# Patient Record
Sex: Female | Born: 1990 | Hispanic: Yes | Marital: Married | Smoking: Never smoker
Health system: Southern US, Community
[De-identification: ages and names within clinical notes are randomized; demographics above are authoritative.]

## PROBLEM LIST (undated history)

## (undated) ENCOUNTER — Inpatient Hospital Stay (HOSPITAL_COMMUNITY): Payer: Self-pay

## (undated) DIAGNOSIS — R51 Headache: Secondary | ICD-10-CM

## (undated) DIAGNOSIS — O039 Complete or unspecified spontaneous abortion without complication: Secondary | ICD-10-CM

## (undated) DIAGNOSIS — K219 Gastro-esophageal reflux disease without esophagitis: Secondary | ICD-10-CM

## (undated) DIAGNOSIS — O99619 Diseases of the digestive system complicating pregnancy, unspecified trimester: Secondary | ICD-10-CM

## (undated) DIAGNOSIS — N83209 Unspecified ovarian cyst, unspecified side: Secondary | ICD-10-CM

## (undated) DIAGNOSIS — D649 Anemia, unspecified: Secondary | ICD-10-CM

## (undated) DIAGNOSIS — Z789 Other specified health status: Secondary | ICD-10-CM

## (undated) DIAGNOSIS — O09299 Supervision of pregnancy with other poor reproductive or obstetric history, unspecified trimester: Secondary | ICD-10-CM

## (undated) HISTORY — PX: DILATION AND CURETTAGE OF UTERUS: SHX78

## (undated) HISTORY — DX: Complete or unspecified spontaneous abortion without complication: O03.9

## (undated) HISTORY — PX: LAPAROSCOPY: SHX197

## (undated) HISTORY — PX: WISDOM TOOTH EXTRACTION: SHX21

## (undated) HISTORY — DX: Anemia, unspecified: D64.9

---
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
## 2008-09-06 ENCOUNTER — Emergency Department: Payer: Self-pay | Admitting: Emergency Medicine

## 2009-01-06 ENCOUNTER — Ambulatory Visit: Payer: Self-pay | Admitting: Family Medicine

## 2009-01-12 ENCOUNTER — Emergency Department: Payer: Self-pay | Admitting: Unknown Physician Specialty

## 2009-01-15 ENCOUNTER — Ambulatory Visit: Payer: Self-pay | Admitting: Obstetrics and Gynecology

## 2009-01-19 ENCOUNTER — Observation Stay: Payer: Self-pay | Admitting: Obstetrics and Gynecology

## 2009-02-04 ENCOUNTER — Ambulatory Visit: Payer: Self-pay | Admitting: Obstetrics and Gynecology

## 2009-04-19 ENCOUNTER — Emergency Department: Payer: Self-pay | Admitting: Emergency Medicine

## 2011-06-20 ENCOUNTER — Emergency Department: Payer: Self-pay | Admitting: Emergency Medicine

## 2011-07-26 ENCOUNTER — Emergency Department: Payer: Self-pay | Admitting: Emergency Medicine

## 2011-07-26 LAB — URINALYSIS, COMPLETE
Bacteria: NONE SEEN
Ph: 6 (ref 4.5–8.0)
Protein: 30

## 2011-07-26 LAB — CBC
HCT: 41.3 % (ref 35.0–47.0)
MCHC: 32.9 g/dL (ref 32.0–36.0)
MCV: 90 fL (ref 80–100)
Platelet: 290 10*3/uL (ref 150–440)
RBC: 4.57 10*6/uL (ref 3.80–5.20)
WBC: 8 10*3/uL (ref 3.6–11.0)

## 2011-07-26 LAB — WET PREP, GENITAL

## 2011-09-22 ENCOUNTER — Ambulatory Visit: Payer: Self-pay | Admitting: Obstetrics and Gynecology

## 2011-09-22 LAB — CBC
HGB: 12.7 g/dL (ref 12.0–16.0)
MCH: 29.8 pg (ref 26.0–34.0)
MCHC: 32.7 g/dL (ref 32.0–36.0)

## 2011-09-22 LAB — COMPREHENSIVE METABOLIC PANEL
Albumin: 4.2 g/dL (ref 3.4–5.0)
Alkaline Phosphatase: 86 U/L (ref 50–136)
Anion Gap: 5 — ABNORMAL LOW (ref 7–16)
BUN: 6 mg/dL — ABNORMAL LOW (ref 7–18)
Bilirubin,Total: 0.3 mg/dL (ref 0.2–1.0)
Creatinine: 0.66 mg/dL (ref 0.60–1.30)
EGFR (African American): >60
EGFR (Non-African Amer.): >60
Glucose: 83 mg/dL (ref 65–99)
Osmolality: 272 (ref 275–301)
SGPT (ALT): 19 U/L (ref 12–78)
Total Protein: 8.2 g/dL (ref 6.4–8.2)

## 2011-09-23 LAB — HCG, QUANTITATIVE, PREGNANCY: Beta Hcg, Quant.: 104 m[IU]/mL — ABNORMAL HIGH

## 2011-09-27 LAB — PATHOLOGY REPORT

## 2011-12-06 ENCOUNTER — Inpatient Hospital Stay (HOSPITAL_COMMUNITY)
Admission: AD | Admit: 2011-12-06 | Discharge: 2011-12-07 | Disposition: A | Discharge status: Home / Self Care | Source: Ambulatory Visit | Attending: Obstetrics and Gynecology | Admitting: Obstetrics and Gynecology

## 2011-12-06 ENCOUNTER — Encounter (HOSPITAL_COMMUNITY): Payer: Self-pay | Admitting: *Deleted

## 2011-12-06 DIAGNOSIS — N949 Unspecified condition associated with female genital organs and menstrual cycle: Secondary | ICD-10-CM | POA: Insufficient documentation

## 2011-12-06 DIAGNOSIS — R1031 Right lower quadrant pain: Secondary | ICD-10-CM | POA: Insufficient documentation

## 2011-12-06 DIAGNOSIS — R102 Pelvic and perineal pain: Secondary | ICD-10-CM

## 2011-12-06 HISTORY — DX: Other specified health status: Z78.9

## 2011-12-06 HISTORY — DX: Unspecified ovarian cyst, unspecified side: N83.209

## 2011-12-06 LAB — WET PREP, GENITAL
Clue Cells Wet Prep HPF POC: NONE SEEN
WBC, Wet Prep HPF POC: NONE SEEN

## 2011-12-06 LAB — URINALYSIS, ROUTINE W REFLEX MICROSCOPIC
Ketones, ur: NEGATIVE mg/dL
Leukocytes, UA: NEGATIVE
Nitrite: NEGATIVE
pH: 6.5 (ref 5.0–8.0)

## 2011-12-06 LAB — URINE MICROSCOPIC-ADD ON

## 2011-12-06 MED ORDER — AZITHROMYCIN 250 MG PO TABS
1000.0000 mg | ORAL_TABLET | ORAL | Status: AC
  Administered 2011-12-06: 1000 mg via ORAL
  Filled 2011-12-06: qty 4

## 2011-12-06 MED ORDER — KETOROLAC TROMETHAMINE 60 MG/2ML IM SOLN
60.0000 mg | Freq: Once | INTRAMUSCULAR | Status: AC
  Administered 2011-12-06: 60 mg via INTRAMUSCULAR
  Filled 2011-12-06: qty 2

## 2011-12-06 MED ORDER — CEFTRIAXONE SODIUM 250 MG IJ SOLR
250.0000 mg | INTRAMUSCULAR | Status: AC
  Administered 2011-12-06: 250 mg via INTRAMUSCULAR
  Filled 2011-12-06: qty 250

## 2011-12-06 NOTE — MAU Provider Note (Signed)
History     CSN: [id number]  Arrival date and time: 12/06/11 2157   First Provider Initiated Contact with Patient 12/06/11 2330      Chief Complaint  Patient presents with  . Abdominal Pain   HPI  Pt here with report of pelvic pain for past month that is usually intermittent in nature; however, tonight it is constant and described as sharp, rated a 9/10.  +nausea, no report of vomiting.  Hx of ovarian cyst.  Patient's last menstrual period was 11/11/2011.   Past Medical History  Diagnosis Date  . No pertinent past medical history   . Ovarian cyst     Past Surgical History  Procedure Date  . Dilation and curettage of uterus   . Laparoscopy     Family History  Problem Relation Age of Onset  . Other Neg Hx     History  Substance Use Topics  . Smoking status: Light Tobacco Smoker  . Smokeless tobacco: Not on file  . Alcohol Use: No    Allergies: No Known Allergies  Prescriptions prior to admission  Medication Sig Dispense Refill  . ibuprofen (ADVIL,MOTRIN) 200 MG tablet Take 400 mg by mouth every 6 (six) hours as needed.        Review of Systems  Constitutional: Negative.  Negative for fever and chills.  Gastrointestinal: Positive for nausea, abdominal pain (RLQ) and constipation. Negative for vomiting and diarrhea.  Genitourinary: Negative.   Neurological: Tingling: No abnormal vaginal discharge.   Physical Exam   Blood pressure 130/80, pulse 104, temperature 98.1 F (36.7 C), temperature source Oral, resp. rate 20, height 5\' 5"  (1.651 m), weight 68.04 kg (150 lb), last menstrual period 11/11/2011, SpO2 100.00%, unknown if currently breastfeeding.  Physical Exam  Constitutional: She is oriented to person, place, and time. She appears well-developed and well-nourished. No distress.       Uncomfortable with exam  HENT:  Head: Normocephalic.  Neck: Normal range of motion. Neck supple.  Cardiovascular: Normal rate, regular rhythm and normal heart sounds.     Respiratory: Effort normal and breath sounds normal.  GI: Soft. She exhibits no mass. There is tenderness (with deep palpation). There is no rebound and no guarding.  Genitourinary: Uterus is tender. Uterus is not enlarged. Cervix exhibits motion tenderness. Cervix exhibits no discharge. No bleeding around the vagina. No vaginal discharge found.  Neurological: She is alert and oriented to person, place, and time. She has normal reflexes.  Skin: Skin is warm and dry.    MAU Course  Procedures Zithromax 1 gm Rocephin 250 mg IM Toradol 60mg  IM  Results for orders placed during the hospital encounter of 12/06/11 (from the past 24 hour(s))  URINALYSIS, ROUTINE W REFLEX MICROSCOPIC     Status: Abnormal   Collection Time   12/06/11 10:15 PM      Component Value Range   Color, Urine YELLOW  YELLOW   APPearance CLEAR  CLEAR   Specific Gravity, Urine 1.025  1.005 - 1.030   pH 6.5  5.0 - 8.0   Glucose, UA NEGATIVE  NEGATIVE mg/dL   Hgb urine dipstick SMALL (*) NEGATIVE   Bilirubin Urine NEGATIVE  NEGATIVE   Ketones, ur NEGATIVE  NEGATIVE mg/dL   Protein, ur NEGATIVE  NEGATIVE mg/dL   Urobilinogen, UA 0.2  0.0 - 1.0 mg/dL   Nitrite NEGATIVE  NEGATIVE   Leukocytes, UA NEGATIVE  NEGATIVE  URINE MICROSCOPIC-ADD ON     Status: Normal   Collection Time  12/06/11 10:15 PM      Component Value Range   Squamous Epithelial / LPF RARE  RARE   RBC / HPF 0-2  <3 RBC/hpf   Urine-Other MUCOUS PRESENT    POCT PREGNANCY, URINE     Status: Normal   Collection Time   12/06/11 11:21 PM      Component Value Range   Preg Test, Ur NEGATIVE  NEGATIVE  WET PREP, GENITAL     Status: Normal   Collection Time   12/06/11 11:35 PM      Component Value Range   Yeast Wet Prep HPF POC NONE SEEN  NONE SEEN   Trich, Wet Prep NONE SEEN  NONE SEEN   Clue Cells Wet Prep HPF POC NONE SEEN  NONE SEEN   WBC, Wet Prep HPF POC NONE SEEN  NONE SEEN   Ultrasound: IMPRESSION:  Normal study. No evidence of pelvic mass  or other significant  Abnormality.  Multiple follicles on right ovary.  CBC    Component Value Date/Time   WBC 11.2* 12/07/2011 0114   RBC 4.17 12/07/2011 0114   HGB 11.9* 12/07/2011 0114   HCT 36.4 12/07/2011 0114   PLT 338 12/07/2011 0114   MCV 87.3 12/07/2011 0114   MCH 28.5 12/07/2011 0114   MCHC 32.7 12/07/2011 0114   RDW 13.2 12/07/2011 0114       Assessment and Plan  Pelvic Pain  Plan: DC to home RX Ibuprofen GC/CT pending  [facility] 12/06/2011, 11:38 PM

## 2011-12-06 NOTE — MAU Note (Signed)
Pt states has had rt lower abd pain for the last month-since yesterday-it is sharper and comes and goes

## 2011-12-07 ENCOUNTER — Inpatient Hospital Stay (HOSPITAL_COMMUNITY)

## 2011-12-07 DIAGNOSIS — N949 Unspecified condition associated with female genital organs and menstrual cycle: Secondary | ICD-10-CM

## 2011-12-07 LAB — GC/CHLAMYDIA PROBE AMP, GENITAL: Chlamydia, DNA Probe: NEGATIVE

## 2011-12-07 LAB — CBC
HCT: 36.4 % (ref 36.0–46.0)
Platelets: 338 10*3/uL (ref 150–400)
RDW: 13.2 % (ref 11.5–15.5)
WBC: 11.2 10*3/uL — ABNORMAL HIGH (ref 4.0–10.5)

## 2011-12-07 MED ORDER — IBUPROFEN 600 MG PO TABS: 600.0000 mg | ORAL_TABLET | Freq: Four times a day (QID) | ORAL | Status: DC | PRN

## 2011-12-18 NOTE — MAU Provider Note (Signed)
Attestation of Attending Supervision of Advanced Practitioner: Evaluation and management procedures were performed by the PA/NP/CNM/OB Fellow under my supervision/collaboration. Chart reviewed and agree with management and plan.  Pearlena Ow V 12/18/2011 12:16 AM

## 2012-02-09 ENCOUNTER — Inpatient Hospital Stay (HOSPITAL_COMMUNITY)

## 2012-02-09 ENCOUNTER — Encounter (HOSPITAL_COMMUNITY): Payer: Self-pay | Admitting: *Deleted

## 2012-02-09 ENCOUNTER — Inpatient Hospital Stay (HOSPITAL_COMMUNITY)
Admission: AD | Admit: 2012-02-09 | Discharge: 2012-02-10 | Disposition: A | Discharge status: Home / Self Care | Source: Ambulatory Visit | Attending: Obstetrics & Gynecology | Admitting: Obstetrics & Gynecology

## 2012-02-09 DIAGNOSIS — K5289 Other specified noninfective gastroenteritis and colitis: Secondary | ICD-10-CM | POA: Insufficient documentation

## 2012-02-09 DIAGNOSIS — N949 Unspecified condition associated with female genital organs and menstrual cycle: Secondary | ICD-10-CM | POA: Insufficient documentation

## 2012-02-09 DIAGNOSIS — R1031 Right lower quadrant pain: Secondary | ICD-10-CM | POA: Insufficient documentation

## 2012-02-09 DIAGNOSIS — O34599 Maternal care for other abnormalities of gravid uterus, unspecified trimester: Secondary | ICD-10-CM | POA: Insufficient documentation

## 2012-02-09 DIAGNOSIS — R42 Dizziness and giddiness: Secondary | ICD-10-CM | POA: Insufficient documentation

## 2012-02-09 DIAGNOSIS — O26859 Spotting complicating pregnancy, unspecified trimester: Secondary | ICD-10-CM | POA: Insufficient documentation

## 2012-02-09 DIAGNOSIS — K529 Noninfective gastroenteritis and colitis, unspecified: Secondary | ICD-10-CM

## 2012-02-09 DIAGNOSIS — N831 Corpus luteum cyst of ovary, unspecified side: Secondary | ICD-10-CM | POA: Insufficient documentation

## 2012-02-09 DIAGNOSIS — O26851 Spotting complicating pregnancy, first trimester: Secondary | ICD-10-CM

## 2012-02-09 LAB — POCT PREGNANCY, URINE: Preg Test, Ur: POSITIVE — AB

## 2012-02-09 LAB — CBC WITH DIFFERENTIAL/PLATELET
Eosinophils Absolute: 0.1 10*3/uL (ref 0.0–0.7)
Eosinophils Relative: 1 % (ref 0–5)
Hemoglobin: 12.2 g/dL (ref 12.0–15.0)
Lymphs Abs: 2.2 10*3/uL (ref 0.7–4.0)
MCH: 28.9 pg (ref 26.0–34.0)
MCV: 85.1 fL (ref 78.0–100.0)
Monocytes Relative: 6 % (ref 3–12)
Neutrophils Relative %: 76 % (ref 43–77)
RBC: 4.22 MIL/uL (ref 3.87–5.11)

## 2012-02-09 LAB — URINALYSIS, ROUTINE W REFLEX MICROSCOPIC
Leukocytes, UA: NEGATIVE
Nitrite: NEGATIVE
Specific Gravity, Urine: <1.005 — ABNORMAL LOW (ref 1.005–1.030)
Urobilinogen, UA: 0.2 mg/dL (ref 0.0–1.0)

## 2012-02-09 LAB — WET PREP, GENITAL: Yeast Wet Prep HPF POC: NONE SEEN

## 2012-02-09 MED ORDER — OXYCODONE-ACETAMINOPHEN 5-325 MG PO TABS
2.0000 | ORAL_TABLET | Freq: Once | ORAL | Status: AC
  Administered 2012-02-09: 2 via ORAL
  Filled 2012-02-09: qty 2

## 2012-02-09 NOTE — MAU Note (Signed)
Pt. Has been cramping since Sunday and has gotten worse the last couple of day.  Pt. Also complains of sharp pain by her left rib cage and she feels dizzy when she stands up and walks around.  Pt. Isn't bleeding, but has some brown tinged discharge.

## 2012-02-09 NOTE — MAU Provider Note (Signed)
Chief Complaint: Abdominal Cramping   First Provider Initiated Contact with Patient 02/09/12 2252     SUBJECTIVE HPI: Katie Rose is a 21 y.o. G4P1020 at 12 weeks by LMP who presents with the following Sx since 02/04/12: 1. Cramping in lower abdomen and sharp pain RLQ/groin. 8/10 on pain scale. (Similar pain w/ Dx ovarian cyst in October--likely close to time of conception) 2. Pain under L rib and hard to catch breath, resolved after BM.  3. Dizziness.  4. Brown discharge x 2 days 5. Loose stools 3x/day and nausea x 3 days.  Denies fever, chills, vomiting, passage of clots or tissue, vaginal discharge, sick contacts. Appendix present. Very anxious due to Hx SAB last August.    Past Medical History  Diagnosis Date  . No pertinent past medical history   . Ovarian cyst        OB History    Grav Para Term Preterm Abortions TAB SAB Ect Mult Living   4 1 1  2  2   1      # Outc Date GA Lbr Len/2nd Wgt Sex Del Anes PTL Lv   1 TRM 2/06    F LTCS   Yes   2 SAB 8/13           3 SAB            4 CUR              Past Surgical History  Procedure Date  . Dilation and curettage of uterus   . Laparoscopy    History   Social History  . Marital Status: Married    Spouse Name: N/A    Number of Children: N/A  . Years of Education: N/A   Occupational History  . Not on file.   Social History Main Topics  . Smoking status: Never Smoker   . Smokeless tobacco: Not on file  . Alcohol Use: No  . Drug Use: No  . Sexually Active: Yes   Other Topics Concern  . Not on file   Social History Narrative  . No narrative on file   No current facility-administered medications on file prior to encounter.   No current outpatient prescriptions on file prior to encounter.   No Known Allergies  ROS: Pertinent items in HPI  OBJECTIVE Blood pressure 111/73, pulse 77, temperature 98.4 F (36.9 C), temperature source Oral, resp. rate 20, height 5\' 5"  (1.651 m), weight 69.945 kg (154 lb  3.2 oz), last menstrual period 11/15/2011. GENERAL: Well-developed, well-nourished female in mild distress, very anxious. HEENT: Normocephalic HEART: normal rate RESP: normal effort ABDOMEN: Soft, right groin tenderness. No tenderness at or near McBurney's point. No mass or rebound.  EXTREMITIES: Nontender, no edema NEURO: Alert and oriented SPECULUM EXAM: NEFG, physiologic discharge, no blood noted, cervix clean BIMANUAL: cervix long and closed; uterus 12-week size, no adnexal tenderness or masses  LAB RESULTS Results for orders placed during the hospital encounter of 02/09/12 (from the past 24 hour(s))  URINALYSIS, ROUTINE W REFLEX MICROSCOPIC     Status: Abnormal   Collection Time   02/09/12  8:24 PM      Component Value Range   Color, Urine YELLOW  YELLOW   APPearance CLEAR  CLEAR   Specific Gravity, Urine <1.005 (*) 1.005 - 1.030   pH 7.0  5.0 - 8.0   Glucose, UA NEGATIVE  NEGATIVE mg/dL   Hgb urine dipstick NEGATIVE  NEGATIVE   Bilirubin Urine NEGATIVE  NEGATIVE  Ketones, ur NEGATIVE  NEGATIVE mg/dL   Protein, ur NEGATIVE  NEGATIVE mg/dL   Urobilinogen, UA 0.2  0.0 - 1.0 mg/dL   Nitrite NEGATIVE  NEGATIVE   Leukocytes, UA NEGATIVE  NEGATIVE  POCT PREGNANCY, URINE     Status: Abnormal   Collection Time   02/09/12 10:06 PM      Component Value Range   Preg Test, Ur POSITIVE (*) NEGATIVE  WET PREP, GENITAL     Status: Abnormal   Collection Time   02/09/12 11:00 PM      Component Value Range   Yeast Wet Prep HPF POC NONE SEEN  NONE SEEN   Trich, Wet Prep NONE SEEN  NONE SEEN   Clue Cells Wet Prep HPF POC FEW (*) NONE SEEN   WBC, Wet Prep HPF POC FEW (*) NONE SEEN  CBC WITH DIFFERENTIAL     Status: Abnormal   Collection Time   02/09/12 11:04 PM      Component Value Range   WBC 12.6 (*) 4.0 - 10.5 K/uL   RBC 4.22  3.87 - 5.11 MIL/uL   Hemoglobin 12.2  12.0 - 15.0 g/dL   HCT 45.4 (*) 09.8 - 11.9 %   MCV 85.1  78.0 - 100.0 fL   MCH 28.9  26.0 - 34.0 pg   MCHC 34.0   30.0 - 36.0 g/dL   RDW 14.7  82.9 - 56.2 %   Platelets 286  150 - 400 K/uL   Neutrophils Relative 76  43 - 77 %   Neutro Abs 9.5 (*) 1.7 - 7.7 K/uL   Lymphocytes Relative 18  12 - 46 %   Lymphs Abs 2.2  0.7 - 4.0 K/uL   Monocytes Relative 6  3 - 12 %   Monocytes Absolute 0.7  0.1 - 1.0 K/uL   Eosinophils Relative 1  0 - 5 %   Eosinophils Absolute 0.1  0.0 - 0.7 K/uL   Basophils Relative 0  0 - 1 %   Basophils Absolute 0.0  0.0 - 0.1 K/uL    IMAGING US Ob Comp Less 14 Wks  02/09/2012  *RADIOLOGY REPORT*  Clinical Data: Spotting and cramping.  OBSTETRIC <14 WK ULTRASOUND, TRANSVAGINAL OB US  Technique:  Transabdominal and transvaginal ultrasound was performed for evaluation of the gestation as well as the maternal uterus and adnexal regions.  Number of gestation: 1 Heart Rate: 158 bpm  CRL:  4.83 cm         11w  5d              [place] EDC: 08/25/2012.  Maternal uterus/adnexae: No subchorionic hemorrhage.  Right ovary appears normal.  Corpus luteum within the right ovary measures 1.5 x 1.4 x 1.4 cm.  Left ovary appears normal.  No free fluid.  IMPRESSION:  1.  Single living intrauterine gestation with an estimated gestational age of [redacted] weeks and 5 days.  The clinical gestational age is 12 weeks and 2 days.  2.  No complications identified.   Original Report Authenticated By: Signa Kell, M.D.    MAU COURSE 2320: Pain resolved w/ percocet.  Low suspicion for appendicitis due to location of pain C/W CLC, absence of fever, very mild leukocytosis, but reviewed appendicitis precautions w/ pt.   ASSESSMENT 1. Corpus luteum cyst   2. Gastroenteritis, acute   3. Spotting complicating pregnancy in first trimester    PLAN Discharge home Appendicitis precautions: Return for fever, worsening pain or GI Sx.  Pelvic rest  x 1 week. Follow-up Information    Follow up with [facility] (as scheduled)       Follow up with [facility] [place] [facility]. (As needed if  symptoms worsen)    Contact information:   [address] [id number] mc [place] [address] [phone]         Pt plans to transfer care to [facility]. Contact info given.     Medication List     As of 02/10/2012 12:58 AM    TAKE these medications         ibuprofen 600 MG tablet   Commonly known as: ADVIL,MOTRIN   Take 1 tablet (600 mg total) by mouth every 6 (six) hours as needed for pain. Do not take after [redacted] weeks gestation.      prenatal multivitamin Tabs   Take 1 tablet by mouth daily.      promethazine 25 MG tablet   Commonly known as: PHENERGAN   Take 1 tablet (25 mg total) by mouth every 6 (six) hours as needed for nausea.          [place], CNM 02/09/2012  11:55 PM

## 2012-02-09 NOTE — MAU Note (Signed)
Having a lot of cramping in lower abdomen and sometimes sharp pain RLQ. Have pain under L rib and hard to catch breath. Dizziness. Everything going on since Sunday. Having some brown discharge.

## 2012-02-10 MED ORDER — IBUPROFEN 600 MG PO TABS: 600.0000 mg | ORAL_TABLET | Freq: Four times a day (QID) | ORAL | Status: DC | PRN

## 2012-02-10 MED ORDER — PROMETHAZINE HCL 25 MG PO TABS: 25.0000 mg | ORAL_TABLET | Freq: Four times a day (QID) | ORAL | Status: DC | PRN

## 2012-02-13 NOTE — MAU Provider Note (Signed)
Attestation of Attending Supervision of Advanced Practitioner (CNM/NP): Evaluation and management procedures were performed by the Advanced Practitioner under my supervision and collaboration. I have reviewed the Advanced Practitioner's note and chart, and I agree with the management and plan.  Intisar Claudio H. 8:05 PM

## 2012-02-28 ENCOUNTER — Encounter

## 2012-03-05 ENCOUNTER — Ambulatory Visit (INDEPENDENT_AMBULATORY_CARE_PROVIDER_SITE_OTHER): Admitting: Family Medicine

## 2012-03-05 ENCOUNTER — Encounter: Payer: Self-pay | Admitting: Family Medicine

## 2012-03-05 VITALS — BP 113/77 | Wt 157.0 lb

## 2012-03-05 DIAGNOSIS — Z23 Encounter for immunization: Secondary | ICD-10-CM

## 2012-03-05 DIAGNOSIS — R300 Dysuria: Secondary | ICD-10-CM | POA: Insufficient documentation

## 2012-03-05 DIAGNOSIS — Z98891 History of uterine scar from previous surgery: Secondary | ICD-10-CM | POA: Insufficient documentation

## 2012-03-05 DIAGNOSIS — Z3480 Encounter for supervision of other normal pregnancy, unspecified trimester: Secondary | ICD-10-CM

## 2012-03-05 DIAGNOSIS — N898 Other specified noninflammatory disorders of vagina: Secondary | ICD-10-CM | POA: Insufficient documentation

## 2012-03-05 DIAGNOSIS — O34219 Maternal care for unspecified type scar from previous cesarean delivery: Secondary | ICD-10-CM

## 2012-03-05 NOTE — Progress Notes (Signed)
Flutters only Anatomy U/S in 3 wks. Need records from [facility] OB transfer--had labs, pap and first screen done there AFP next visit Wet prep and Urine Cx today. Flu shot today

## 2012-03-05 NOTE — Patient Instructions (Addendum)
Vaginal Birth After Cesarean Delivery Vaginal birth after Cesarean delivery (VBAC) is giving birth vaginally after previously delivering a baby by a cesarean. In the past, if a woman had a Cesarean delivery, all births afterwards would be done by Cesarean delivery. This is no longer true. It can be safe for the mother to try a vaginal delivery after having a Cesarean. The final decision to have a VBAC or repeat Cesarean delivery should be between the patient and her caregiver. The risks and benefits can be discussed relative to the reason for, and the type of the previous Cesarean delivery. WOMEN WHO PLAN TO HAVE A VBAC SHOULD CHECK WITH THEIR DOCTOR TO BE SURE THAT:  The previous Cesarean was done with a low transverse uterine incision (not a vertical classical incision).  The birth canal is big enough for the baby.  There were no other operations on the uterus.  They will have an electronic fetal monitor (EFM) on at all times during labor.  An operating room would be available and ready in case an emergency Cesarean is needed.  A doctor and surgical nursing staff would be available at all times during labor to be ready to do an emergency Cesarean if necessary.  An anesthesiologist would be present in case an emergency Cesarean is needed.  The nursery is prepared and has adequate personnel and necessary equipment available to care for the baby in case of an emergency Cesarean. BENEFITS OF VBAC:  Shorter stay in the hospital.  Lower delivery, nursery and hospital costs.  Less blood loss and need for blood transfusions.  Less fever and discomfort from major surgery.  Lower risk of blood clots.  Lower risk of infection.  Shorter recovery after going home.  Lower risk of other surgical complications, such as opening of the incision or hernia in the incision.  Decreased risk of injury to other organs.  Decreased risk for having to remove the uterus (hysterectomy).  Decreased risk  for the placenta to completely or partially cover the opening of the uterus (placenta previa) with a future pregnancy.  Ability to have a larger family if desired. RISKS OF A VBAC:  Rupture of the uterus.  Having to remove the uterus (hysterectomy) if it ruptures.  All the complications of major surgery and/or injury to other organs.  Excessive bleeding, blood clots and infection.  Lower Apgar scores (method to evaluate the newborn based on appearance, pulse, grimace, activity, and respiration) and more risks to the baby.  There is a higher risk of uterine rupture if you induce or augment labor.  There is a higher risk of uterine rupture if you use medications to ripen the cervix. VBAC SHOULD NOT BE DONE IF:  The previous Cesarean was done with a vertical (classical) or T-shaped incision, or you do not know what kind of an incision was made.  You had a ruptured uterus.  You had surgery on your uterus.  You have medical or obstetrical problems.  There are problems with the baby.  There were two previous Cesarean deliveries and no vaginal deliveries. OTHER FACTS TO KNOW ABOUT VBAC:  It is safe to have an epidural anesthetic with VBAC.  It is safe to turn the baby from a breech position (attempt an external cephalic version).  It is safe to try a VBAC with twins.  Pregnancies later than 40 weeks have not been successful with VBAC.  There is an increased failure rate of a VBAC in obese pregnant women.  There is  an increased failure rate with VABC if the baby weighs 8.8 pounds (4000 grams) or more.  There is an increased failure rate if the time between the Cesarean and VBAC is less than 19 months.  There is an increased failure rate if pre-eclampsia is present (high blood pressure, protein in the urine and swelling of face and extremities).  VBAC is very successful if there was a previous vaginal birth.  VBAC is very successful when the labor starts spontaneously before  the due date.  Delivery of VBAC is similar to having a normal spontaneous vaginal delivery. It is important to discuss VBAC with your caregiver early in the pregnancy so you can understand the risks, benefits and options. It will give you time to decide what is best in your particular case relevant to the reason for your previous Cesarean delivery. It should be understood that medical changes in the mother or pregnancy may occur during the pregnancy, which make it necessary to change you or your caregiver's initial decision. The counseling, concerns and decisions should be documented in the medical record and signed by all parties. Document Released: 07/23/2006 Document Revised: 04/24/2011 Document Reviewed: 03/13/2008 [facility] Patient Information 2013 [place], [place].  Pregnancy - Second Trimester The second trimester of pregnancy (3 to 6 months) is a period of rapid growth for you and your baby. At the end of the sixth month, your baby is about 9 inches long and weighs 1 1/2 pounds. You will begin to feel the baby move between 18 and 20 weeks of the pregnancy. This is called quickening. Weight gain is faster. A clear fluid (colostrum) may leak out of your breasts. You may feel small contractions of the womb (uterus). This is known as false labor or Braxton-Hicks contractions. This is like a practice for labor when the baby is ready to be born. Usually, the problems with morning sickness have usually passed by the end of your first trimester. Some women develop small dark blotches (called cholasma, mask of pregnancy) on their face that usually goes away after the baby is born. Exposure to the sun makes the blotches worse. Acne may also develop in some pregnant women and pregnant women who have acne, may find that it goes away. PRENATAL EXAMS  Blood work may continue to be done during prenatal exams. These tests are done to check on your health and the probable health of your baby. Blood work is used to  follow your blood levels (hemoglobin). Anemia (low hemoglobin) is common during pregnancy. Iron and vitamins are given to help prevent this. You will also be checked for diabetes between 24 and 28 weeks of the pregnancy. Some of the previous blood tests may be repeated.  The size of the uterus is measured during each visit. This is to make sure that the baby is continuing to grow properly according to the dates of the pregnancy.  Your blood pressure is checked every prenatal visit. This is to make sure you are not getting toxemia.  Your urine is checked to make sure you do not have an infection, diabetes or protein in the urine.  Your weight is checked often to make sure gains are happening at the suggested rate. This is to ensure that both you and your baby are growing normally.  Sometimes, an ultrasound is performed to confirm the proper growth and development of the baby. This is a test which bounces harmless sound waves off the baby so your caregiver can more accurately determine due dates.  Sometimes, a specialized test is done on the amniotic fluid surrounding the baby. This test is called an amniocentesis. The amniotic fluid is obtained by sticking a needle into the belly (abdomen). This is done to check the chromosomes in instances where there is a concern about possible genetic problems with the baby. It is also sometimes done near the end of pregnancy if an early delivery is required. In this case, it is done to help make sure the baby's lungs are mature enough for the baby to live outside of the womb. CHANGES OCCURING IN THE SECOND TRIMESTER OF PREGNANCY Your body goes through many changes during pregnancy. They vary from person to person. Talk to your caregiver about changes you notice that you are concerned about.  During the second trimester, you will likely have an increase in your appetite. It is normal to have cravings for certain foods. This varies from person to person and pregnancy  to pregnancy.  Your lower abdomen will begin to bulge.  You may have to urinate more often because the uterus and baby are pressing on your bladder. It is also common to get more bladder infections during pregnancy (pain with urination). You can help this by drinking lots of fluids and emptying your bladder before and after intercourse.  You may begin to get stretch marks on your hips, abdomen, and breasts. These are normal changes in the body during pregnancy. There are no exercises or medications to take that prevent this change.  You may begin to develop swollen and bulging veins (varicose veins) in your legs. Wearing support hose, elevating your feet for 15 minutes, 3 to 4 times a day and limiting salt in your diet helps lessen the problem.  Heartburn may develop as the uterus grows and pushes up against the stomach. Antacids recommended by your caregiver helps with this problem. Also, eating smaller meals 4 to 5 times a day helps.  Constipation can be treated with a stool softener or adding bulk to your diet. Drinking lots of fluids, vegetables, fruits, and whole grains are helpful.  Exercising is also helpful. If you have been very active up until your pregnancy, most of these activities can be continued during your pregnancy. If you have been less active, it is helpful to start an exercise program such as walking.  Hemorrhoids (varicose veins in the rectum) may develop at the end of the second trimester. Warm sitz baths and hemorrhoid cream recommended by your caregiver helps hemorrhoid problems.  Backaches may develop during this time of your pregnancy. Avoid heavy lifting, wear low heal shoes and practice good posture to help with backache problems.  Some pregnant women develop tingling and numbness of their hand and fingers because of swelling and tightening of ligaments in the wrist (carpel tunnel syndrome). This goes away after the baby is born.  As your breasts enlarge, you may  have to get a bigger bra. Get a comfortable, cotton, support bra. Do not get a nursing bra until the last month of the pregnancy if you will be nursing the baby.  You may get a dark line from your belly button to the pubic area called the linea nigra.  You may develop rosy cheeks because of increase blood flow to the face.  You may develop spider looking lines of the face, neck, arms and chest. These go away after the baby is born. HOME CARE INSTRUCTIONS   It is extremely important to avoid all smoking, herbs, alcohol, and unprescribed drugs during  your pregnancy. These chemicals affect the formation and growth of the baby. Avoid these chemicals throughout the pregnancy to ensure the delivery of a healthy infant.  Most of your home care instructions are the same as suggested for the first trimester of your pregnancy. Keep your caregiver's appointments. Follow your caregiver's instructions regarding medication use, exercise and diet.  During pregnancy, you are providing food for you and your baby. Continue to eat regular, well-balanced meals. Choose foods such as meat, fish, milk and other low fat dairy products, vegetables, fruits, and whole-grain breads and cereals. Your caregiver will tell you of the ideal weight gain.  A physical sexual relationship may be continued up until near the end of pregnancy if there are no other problems. Problems could include early (premature) leaking of amniotic fluid from the membranes, vaginal bleeding, abdominal pain, or other medical or pregnancy problems.  Exercise regularly if there are no restrictions. Check with your caregiver if you are unsure of the safety of some of your exercises. The greatest weight gain will occur in the last 2 trimesters of pregnancy. Exercise will help you:  Control your weight.  Get you in shape for labor and delivery.  Lose weight after you have the baby.  Wear a good support or jogging bra for breast tenderness during  pregnancy. This may help if worn during sleep. Pads or tissues may be used in the bra if you are leaking colostrum.  Do not use hot tubs, steam rooms or saunas throughout the pregnancy.  Wear your seat belt at all times when driving. This protects you and your baby if you are in an accident.  Avoid raw meat, uncooked cheese, cat litter boxes and soil used by cats. These carry germs that can cause birth defects in the baby.  The second trimester is also a good time to visit your dentist for your dental health if this has not been done yet. Getting your teeth cleaned is OK. Use a soft toothbrush. Brush gently during pregnancy.  It is easier to loose urine during pregnancy. Tightening up and strengthening the pelvic muscles will help with this problem. Practice stopping your urination while you are going to the bathroom. These are the same muscles you need to strengthen. It is also the muscles you would use as if you were trying to stop from passing gas. You can practice tightening these muscles up 10 times a set and repeating this about 3 times per day. Once you know what muscles to tighten up, do not perform these exercises during urination. It is more likely to contribute to an infection by backing up the urine.  Ask for help if you have financial, counseling or nutritional needs during pregnancy. Your caregiver will be able to offer counseling for these needs as well as refer you for other special needs.  Your skin may become oily. If so, wash your face with mild soap, use non-greasy moisturizer and oil or cream based makeup. MEDICATIONS AND DRUG USE IN PREGNANCY  Take prenatal vitamins as directed. The vitamin should contain 1 milligram of folic acid. Keep all vitamins out of reach of children. Only a couple vitamins or tablets containing iron may be fatal to a baby or young child when ingested.  Avoid use of all medications, including herbs, over-the-counter medications, not prescribed or  suggested by your caregiver. Only take over-the-counter or prescription medicines for pain, discomfort, or fever as directed by your caregiver. Do not use aspirin.  Let your caregiver also  know about herbs you may be using.  Alcohol is related to a number of birth defects. This includes fetal alcohol syndrome. All alcohol, in any form, should be avoided completely. Smoking will cause low birth rate and premature babies.  Street or illegal drugs are very harmful to the baby. They are absolutely forbidden. A baby born to an addicted mother will be addicted at birth. The baby will go through the same withdrawal an adult does. SEEK MEDICAL CARE IF:  You have any concerns or worries during your pregnancy. It is better to call with your questions if you feel they cannot wait, rather than worry about them. SEEK IMMEDIATE MEDICAL CARE IF:   An unexplained oral temperature above 102 F (38.9 C) develops, or as your caregiver suggests.  You have leaking of fluid from the vagina (birth canal). If leaking membranes are suspected, take your temperature and tell your caregiver of this when you call.  There is vaginal spotting, bleeding, or passing clots. Tell your caregiver of the amount and how many pads are used. Light spotting in pregnancy is common, especially following intercourse.  You develop a bad smelling vaginal discharge with a change in the color from clear to white.  You continue to feel sick to your stomach (nauseated) and have no relief from remedies suggested. You vomit blood or coffee ground-like materials.  You lose more than 2 pounds of weight or gain more than 2 pounds of weight over 1 week, or as suggested by your caregiver.  You notice swelling of your face, hands, feet, or legs.  You get exposed to [place] measles and have never had them.  You are exposed to fifth disease or chickenpox.  You develop belly (abdominal) pain. Round ligament discomfort is a common non-cancerous  (benign) cause of abdominal pain in pregnancy. Your caregiver still must evaluate you.  You develop a bad headache that does not go away.  You develop fever, diarrhea, pain with urination, or shortness of breath.  You develop visual problems, blurry, or double vision.  You fall or are in a car accident or any kind of trauma.  There is mental or physical violence at home. Document Released: 01/24/2001 Document Revised: 04/24/2011 Document Reviewed: 07/29/2008 [facility] Patient Information 2013 [place], [place].  Breastfeeding Deciding to breastfeed is one of the best choices you can make for you and your baby. The information that follows gives a brief overview of the benefits of breastfeeding as well as common topics surrounding breastfeeding. BENEFITS OF BREASTFEEDING For the baby  The first milk (colostrum) helps the baby's digestive system function better.   There are antibodies in the mother's milk that help the baby fight off infections.   The baby has a lower incidence of asthma, allergies, and sudden infant death syndrome (SIDS).   The nutrients in breast milk are better for the baby than infant formulas, and breast milk helps the baby's brain grow better.   Babies who breastfeed have less gas, colic, and constipation.  For the mother  Breastfeeding helps develop a very special bond between the mother and her baby.   Breastfeeding is convenient, always available at the correct temperature, and costs nothing.   Breastfeeding burns calories in the mother and helps her lose weight that was gained during pregnancy.   Breastfeeding makes the uterus contract back down to normal size faster and slows bleeding following delivery.   Breastfeeding mothers have a lower risk of developing breast cancer.  BREASTFEEDING FREQUENCY  A healthy, full-term  baby may breastfeed as often as every hour or space his or her feedings to every 3 hours.   Watch your baby for signs of  hunger. Nurse your baby if he or she shows signs of hunger. How often you nurse will vary from baby to baby.   Nurse as often as the baby requests, or when you feel the need to reduce the fullness of your breasts.   Awaken the baby if it has been 3 4 hours since the last feeding.   Frequent feeding will help the mother make more milk and will help prevent problems, such as sore nipples and engorgement of the breasts.  BABY'S POSITION AT THE BREAST  Whether lying down or sitting, be sure that the baby's tummy is facing your tummy.   Support the breast with 4 fingers underneath the breast and the thumb above. Make sure your fingers are well away from the nipple and baby's mouth.   Stroke the baby's lips gently with your finger or nipple.   When the baby's mouth is open wide enough, place all of your nipple and as much of the areola as possible into your baby's mouth.   Pull the baby in close so the tip of the nose and the baby's cheeks touch the breast during the feeding.  FEEDINGS AND SUCTION  The length of each feeding varies from baby to baby and from feeding to feeding.   The baby must suck about 2 3 minutes for your milk to get to him or her. This is called a "let down." For this reason, allow the baby to feed on each breast as long as he or she wants. Your baby will end the feeding when he or she has received the right balance of nutrients.   To break the suction, put your finger into the corner of the baby's mouth and slide it between his or her gums before removing your breast from his or her mouth. This will help prevent sore nipples.  HOW TO TELL WHETHER YOUR BABY IS GETTING ENOUGH BREAST MILK. Wondering whether or not your baby is getting enough milk is a common concern among mothers. You can be assured that your baby is getting enough milk if:   Your baby is actively sucking and you hear swallowing.   Your baby seems relaxed and satisfied after a feeding.    Your baby nurses at least 8 12 times in a 24 hour time period. Nurse your baby until he or she unlatches or falls asleep at the first breast (at least 10 20 minutes), then offer the second side.   Your baby is wetting 5 6 disposable diapers (6 8 cloth diapers) in a 24 hour period by 50 109 days of age.   Your baby is having at least 3 4 stools every 24 hours for the first 6 weeks. The stool should be soft and yellow.   Your baby should gain 4 7 ounces per week after he or she is 46 days old.   Your breasts feel softer after nursing.  REDUCING BREAST ENGORGEMENT  In the first week after your baby is born, you may experience signs of breast engorgement. When breasts are engorged, they feel heavy, warm, full, and may be tender to the touch. You can reduce engorgement if you:   Nurse frequently, every 2 3 hours. Mothers who breastfeed early and often have fewer problems with engorgement.   Place light ice packs on your breasts for 10 20 minutes  between feedings. This reduces swelling. Wrap the ice packs in a lightweight towel to protect your skin. Bags of frozen vegetables work well for this purpose.   Take a warm shower or apply warm, moist heat to your breast for 5 10 minutes just before each feeding. This increases circulation and helps the milk flow.   Gently massage your breast before and during the feeding. Using your finger tips, massage from the chest wall towards your nipple in a circular motion.   Make sure that the baby empties at least one breast at every feeding before switching sides.   Use a breast pump to empty the breasts if your baby is sleepy or not nursing well. You may also want to pump if you are returning to work oryou feel you are getting engorged.   Avoid bottle feeds, pacifiers, or supplemental feedings of water or juice in place of breastfeeding. Breast milk is all the food your baby needs. It is not necessary for your baby to have water or formula. In  fact, to help your breasts make more milk, it is best not to give your baby supplemental feedings during the early weeks.   Be sure the baby is latched on and positioned properly while breastfeeding.   Wear a supportive bra, avoiding underwire styles.   Eat a balanced diet with enough fluids.   Rest often, relax, and take your prenatal vitamins to prevent fatigue, stress, and anemia.  If you follow these suggestions, your engorgement should improve in 24 48 hours. If you are still experiencing difficulty, call your lactation consultant or caregiver.  CARING FOR YOURSELF Take care of your breasts  Bathe or shower daily.   Avoid using soap on your nipples.   Start feedings on your left breast at one feeding and on your right breast at the next feeding.   You will notice an increase in your milk supply 2 5 days after delivery. You may feel some discomfort from engorgement, which makes your breasts very firm and often tender. Engorgement "peaks" out within 24 48 hours. In the meantime, apply warm moist towels to your breasts for 5 10 minutes before feeding. Gentle massage and expression of some milk before feeding will soften your breasts, making it easier for your baby to latch on.   Wear a well-fitting nursing bra, and air dry your nipples for a 3 after each feeding.   Only use cotton bra pads.   Only use pure lanolin on your nipples after nursing. You do not need to wash it off before feeding the baby again. Another option is to express a few drops of breast milk and gently massage it into your nipples.  Take care of yourself  Eat well-balanced meals and nutritious snacks.   Drinking milk, fruit juice, and water to satisfy your thirst (about 8 glasses a day).   Get plenty of rest.  Avoid foods that you notice affect the baby in a bad way.  SEEK MEDICAL CARE IF:   You have difficulty with breastfeeding and need help.   You have a hard, red, sore area on  your breast that is accompanied by a fever.   Your baby is too sleepy to eat well or is having trouble sleeping.   Your baby is wetting less than 6 diapers a day, by 13 days of age.   Your baby's skin or white part of his or her eyes is more yellow than it was in the hospital.   You  feel depressed.  Document Released: 01/30/2005 Document Revised: 08/01/2011 Document Reviewed: 04/30/2011 [facility] Patient Information 2013 [place], [place].

## 2012-03-06 ENCOUNTER — Other Ambulatory Visit: Payer: Self-pay | Admitting: Family Medicine

## 2012-03-06 DIAGNOSIS — B9689 Other specified bacterial agents as the cause of diseases classified elsewhere: Secondary | ICD-10-CM

## 2012-03-06 DIAGNOSIS — N760 Acute vaginitis: Secondary | ICD-10-CM

## 2012-03-06 LAB — WET PREP, GENITAL
Trich, Wet Prep: NONE SEEN
Yeast Wet Prep HPF POC: NONE SEEN

## 2012-03-06 MED ORDER — METRONIDAZOLE 500 MG PO TABS: 500.0000 mg | ORAL_TABLET | Freq: Two times a day (BID) | ORAL | Status: DC

## 2012-03-06 NOTE — Progress Notes (Signed)
Wet prep shows BV--will treat

## 2012-03-07 LAB — CULTURE, OB URINE: Organism ID, Bacteria: NO GROWTH

## 2012-03-17 ENCOUNTER — Inpatient Hospital Stay (HOSPITAL_COMMUNITY)
Admission: AD | Admit: 2012-03-17 | Discharge: 2012-03-18 | Disposition: A | Discharge status: Hospice | Source: Ambulatory Visit | Attending: Obstetrics & Gynecology | Admitting: Obstetrics & Gynecology

## 2012-03-17 ENCOUNTER — Encounter (HOSPITAL_COMMUNITY): Payer: Self-pay | Admitting: *Deleted

## 2012-03-17 DIAGNOSIS — O26899 Other specified pregnancy related conditions, unspecified trimester: Secondary | ICD-10-CM

## 2012-03-17 DIAGNOSIS — Z3480 Encounter for supervision of other normal pregnancy, unspecified trimester: Secondary | ICD-10-CM

## 2012-03-17 DIAGNOSIS — R109 Unspecified abdominal pain: Secondary | ICD-10-CM | POA: Insufficient documentation

## 2012-03-17 DIAGNOSIS — O34219 Maternal care for unspecified type scar from previous cesarean delivery: Secondary | ICD-10-CM | POA: Insufficient documentation

## 2012-03-17 DIAGNOSIS — R300 Dysuria: Secondary | ICD-10-CM | POA: Insufficient documentation

## 2012-03-17 DIAGNOSIS — O99891 Other specified diseases and conditions complicating pregnancy: Secondary | ICD-10-CM | POA: Insufficient documentation

## 2012-03-17 DIAGNOSIS — R102 Pelvic and perineal pain: Secondary | ICD-10-CM

## 2012-03-17 LAB — URINALYSIS, ROUTINE W REFLEX MICROSCOPIC
Glucose, UA: NEGATIVE mg/dL
Ketones, ur: NEGATIVE mg/dL
Leukocytes, UA: NEGATIVE
Protein, ur: NEGATIVE mg/dL

## 2012-03-17 LAB — URINE MICROSCOPIC-ADD ON

## 2012-03-17 NOTE — MAU Provider Note (Signed)
History     CSN: [id number]  Arrival date and time: 03/17/12 2318   None     Chief Complaint  Patient presents with  . Abdominal Pain   HPI  Katie Rose is a .22 y.o. [id number] at [redacted]w[redacted]d who presents today with pelvic pressure and pain with urination.   Past Medical History  Diagnosis Date  . No pertinent past medical history   . Ovarian cyst   . SAB (spontaneous abortion)     x2  . Anemia     Past Surgical History  Procedure Date  . Laparoscopy   . Wisdom tooth extraction     x4  . Dilation and curettage of uterus     x 2  . Cesarean section     Family History  Problem Relation Age of Onset  . Other Neg Hx   . Alcohol abuse Father   . Cancer Maternal Grandmother     cervical  . Hypertension Mother     History  Substance Use Topics  . Smoking status: Never Smoker   . Smokeless tobacco: Not on file  . Alcohol Use: No    Allergies: No Known Allergies  Prescriptions prior to admission  Medication Sig Dispense Refill  . metroNIDAZOLE (FLAGYL) 500 MG tablet Take 1 tablet (500 mg total) by mouth 2 (two) times daily.  14 tablet  0  . Prenatal Vit-Fe Fumarate-FA (PRENATAL MULTIVITAMIN) TABS Take 1 tablet by mouth daily.      . promethazine (PHENERGAN) 25 MG tablet Take 1 tablet (25 mg total) by mouth every 6 (six) hours as needed for nausea.  30 tablet  1    Review of Systems  Constitutional: Negative for fever and chills.  Gastrointestinal: Positive for abdominal pain (suprapubic pain). Negative for nausea, vomiting, diarrhea and constipation.  Genitourinary: Positive for dysuria, urgency, frequency and hematuria.  Musculoskeletal: Negative for myalgias.  Neurological: Negative for dizziness.   Physical Exam   Blood pressure 119/66, pulse 89, temperature 98 F (36.7 C), temperature source Oral, resp. rate 18, height 5\' 5"  (1.651 m), weight 73.936 kg (163 lb), last menstrual period 11/15/2011, SpO2 100.00%, unknown if currently  breastfeeding.  Physical Exam  Nursing note and vitals reviewed. Constitutional: She is oriented to person, place, and time. She appears well-developed and well-nourished. No distress.  Cardiovascular: Normal rate.   Respiratory: Effort normal.  GI: Soft. She exhibits no distension. There is no tenderness.  Genitourinary:        External: no lesion Vagina: pink Cervix: closed/thick/posterior Uterus: AGA No CVA tenderness  Neurological: She is alert and oriented to person, place, and time.  Skin: Skin is warm and dry.  Psychiatric: She has a normal mood and affect.    MAU Course  Procedures  Results for orders placed during the hospital encounter of 03/17/12 (from the past 24 hour(s))  URINALYSIS, ROUTINE W REFLEX MICROSCOPIC     Status: Abnormal   Collection Time   03/17/12 11:26 PM      Component Value Range   Color, Urine YELLOW  YELLOW   APPearance TURBID (*) CLEAR   Specific Gravity, Urine 1.015  1.005 - 1.030   pH 8.0  5.0 - 8.0   Glucose, UA NEGATIVE  NEGATIVE mg/dL   Hgb urine dipstick NEGATIVE  NEGATIVE   Bilirubin Urine NEGATIVE  NEGATIVE   Ketones, ur NEGATIVE  NEGATIVE mg/dL   Protein, ur NEGATIVE  NEGATIVE mg/dL   Urobilinogen, UA 0.2  0.0 - 1.0 mg/dL  Nitrite NEGATIVE  NEGATIVE   Leukocytes, UA NEGATIVE  NEGATIVE  URINE MICROSCOPIC-ADD ON     Status: Normal   Collection Time   03/17/12 11:26 PM      Component Value Range   Squamous Epithelial / LPF RARE  RARE   WBC, UA 0-2  <3 WBC/hpf   RBC / HPF 0-2  <3 RBC/hpf   Bacteria, UA RARE  RARE   Urine-Other AMORPHOUS URATES/PHOSPHATES    WET PREP, GENITAL     Status: Abnormal   Collection Time   03/17/12 11:50 PM      Component Value Range   Yeast Wet Prep HPF POC NONE SEEN  NONE SEEN   Trich, Wet Prep NONE SEEN  NONE SEEN   Clue Cells Wet Prep HPF POC NONE SEEN  NONE SEEN   WBC, Wet Prep HPF POC FEW (*) NONE SEEN   *RADIOLOGY REPORT*  Clinical Data: Pelvic pain for 2 days. Assigned gestational age is  17  weeks 5 days.  LIMITED OBSTETRIC ULTRASOUND  Number of Fetuses: 1  Heart Rate: 149 bpm  Movement: Fetal motion is identified.  Breathing: Fetal breathing is identified.  Presentation: Breech presentation.  Placental Location: Placenta is posterior and is not low-lying.  Previa: No previa.  Amniotic Fluid (Subjective): Normal  Nuchal cord with two wraps is noted. Flow is shown throughout the  visualized umbilical cord on color flow Doppler imaging.  Vertical Pocket 4 cm  BPD: 3.55 cm 17 w 0 d EDC: 08/21/2012  MATERNAL FINDINGS:  Cervix: Cervix measures 3.2 cm length and is closed.  Uterus/Adnexae: Uterus is not well visualized. Both ovaries are  visualized and appear symmetrical. Right ovary measures 2.8 x 2.1  x 2 cm. Left ovary measures 2.1 x 1.7 x 1.9 cm. No abnormal  adnexal masses. No free pelvic fluid collections.  IMPRESSION:  Single intrauterine pregnancy demonstrated. No evidence of  placenta previa or abruption. The cervix is closed. Nuchal cord  is present.  Recommend followup with non-emergent complete OB 14+ wk US  examination for fetal biometric evaluation and anatomic survey if  not already performed.  Original Report Authenticated By: Burman Nieves, M.D.  Assessment and Plan   1. Supervision of other normal pregnancy   2. Previous cesarean delivery, antepartum condition or complication   3. Pain of round ligament complicating pregnancy, antepartum   4. Abdominal pain in pregnancy   5. Dysuria    Urine Culture pending RX pyridium while waiting on results FU with PCP as scheduled 2nd trimester danger signs reviewed Tawnya Crook 03/17/2012, 11:50 PM

## 2012-03-17 NOTE — MAU Note (Signed)
Pt reports "i have been having a lot of cramping" , states it has been happening x 3 days but has worsening today. Pressure with urination and frequency. Denies bleeding.

## 2012-03-18 ENCOUNTER — Inpatient Hospital Stay (HOSPITAL_COMMUNITY)

## 2012-03-18 DIAGNOSIS — R109 Unspecified abdominal pain: Secondary | ICD-10-CM

## 2012-03-18 DIAGNOSIS — R300 Dysuria: Secondary | ICD-10-CM

## 2012-03-18 DIAGNOSIS — O34219 Maternal care for unspecified type scar from previous cesarean delivery: Secondary | ICD-10-CM

## 2012-03-18 LAB — WET PREP, GENITAL

## 2012-03-18 LAB — GC/CHLAMYDIA PROBE AMP: CT Probe RNA: NEGATIVE

## 2012-03-18 MED ORDER — PHENAZOPYRIDINE HCL 100 MG PO TABS
200.0000 mg | ORAL_TABLET | Freq: Once | ORAL | Status: AC
  Administered 2012-03-18: 200 mg via ORAL
  Filled 2012-03-18: qty 2

## 2012-03-18 MED ORDER — PHENAZOPYRIDINE HCL 200 MG PO TABS: 200.0000 mg | ORAL_TABLET | Freq: Three times a day (TID) | ORAL | Status: DC | PRN

## 2012-03-19 LAB — URINE CULTURE

## 2012-03-20 NOTE — MAU Provider Note (Signed)
Attestation of Attending Supervision of Advanced Practitioner (CNM/NP): Evaluation and management procedures were performed by the Advanced Practitioner under my supervision and collaboration. I have reviewed the Advanced Practitioner's note and chart, and I agree with the management and plan.  Cayley Pester H. 9:40 PM   

## 2012-03-26 ENCOUNTER — Ambulatory Visit (HOSPITAL_COMMUNITY)
Admission: RE | Admit: 2012-03-26 | Discharge: 2012-03-26 | Disposition: A | Discharge status: Home / Self Care | Source: Ambulatory Visit | Attending: Family Medicine | Admitting: Family Medicine

## 2012-03-26 ENCOUNTER — Encounter: Payer: Self-pay | Admitting: Family Medicine

## 2012-03-26 DIAGNOSIS — Z3480 Encounter for supervision of other normal pregnancy, unspecified trimester: Secondary | ICD-10-CM

## 2012-03-26 DIAGNOSIS — Z1389 Encounter for screening for other disorder: Secondary | ICD-10-CM | POA: Insufficient documentation

## 2012-03-26 DIAGNOSIS — Z363 Encounter for antenatal screening for malformations: Secondary | ICD-10-CM | POA: Insufficient documentation

## 2012-03-26 DIAGNOSIS — O358XX0 Maternal care for other (suspected) fetal abnormality and damage, not applicable or unspecified: Secondary | ICD-10-CM | POA: Insufficient documentation

## 2012-03-26 DIAGNOSIS — O34219 Maternal care for unspecified type scar from previous cesarean delivery: Secondary | ICD-10-CM | POA: Insufficient documentation

## 2012-03-26 IMAGING — US US OB DETAIL+14 WK
1 series · 12 of 28 positions shown · non-contrast
Comparison: none

[Series 1: us ob detail +14 wk · 88 acquisitions, 12 frames shown]
[im 4/88]
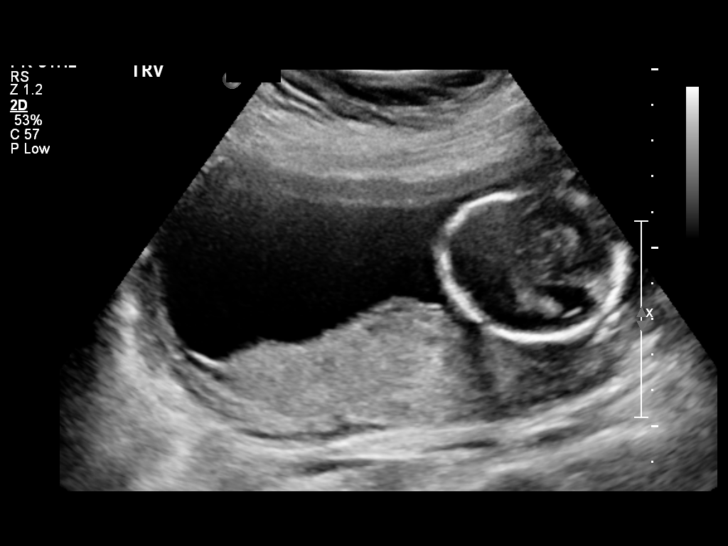
[im 10/88]
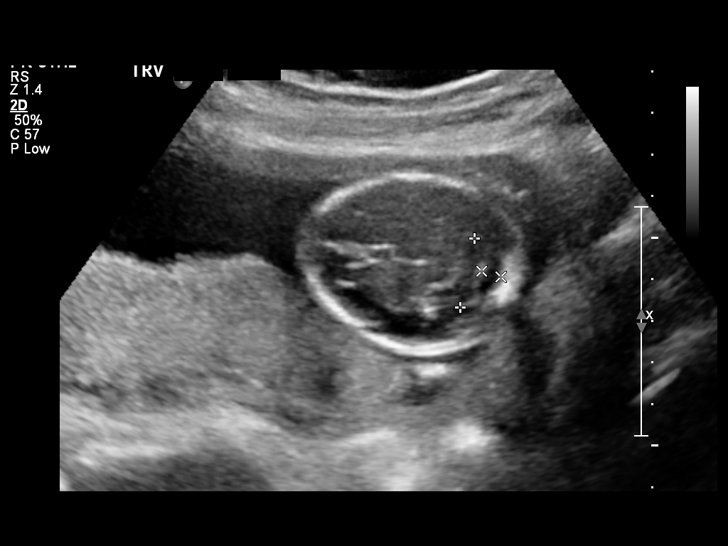
[im 17/88]
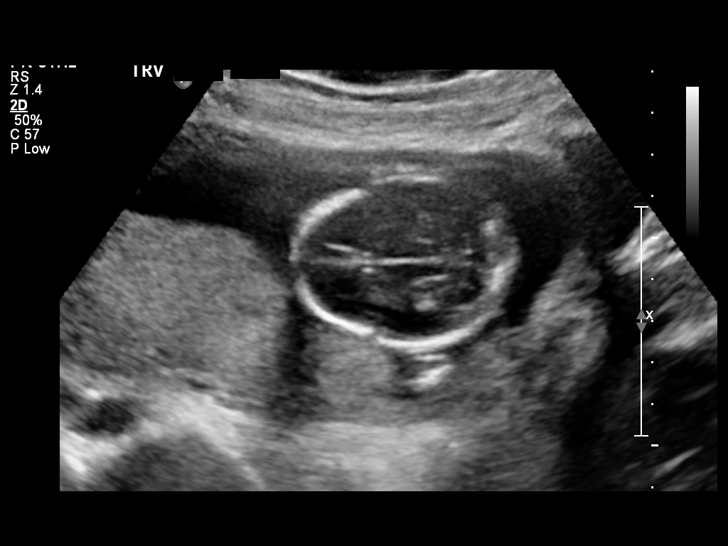
[im 26/88]
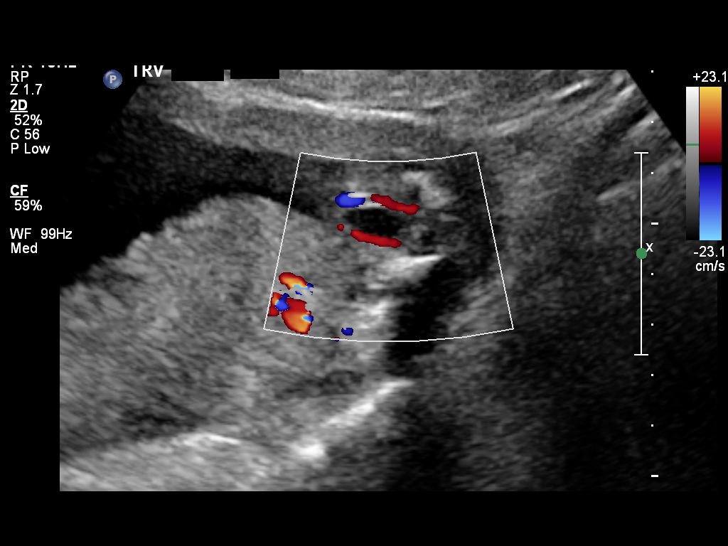
[im 33/88]
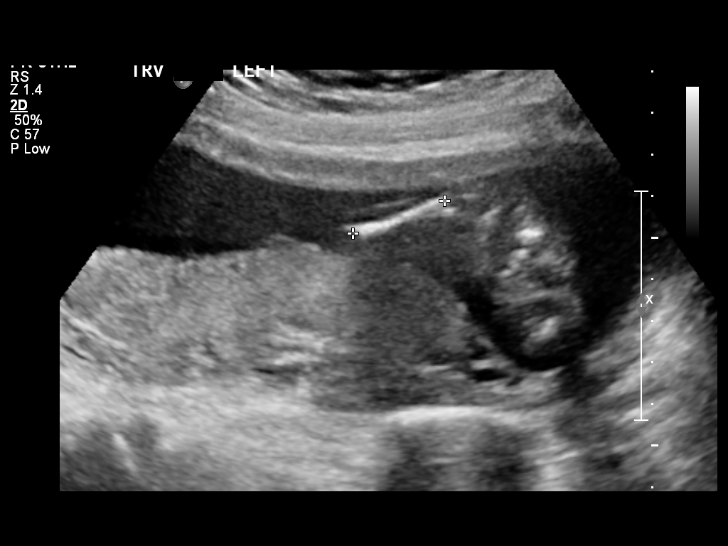
[im 39/88]
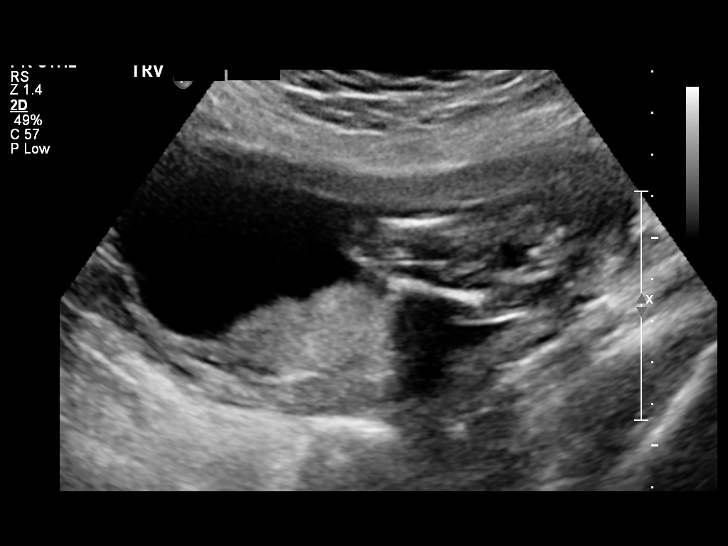
[im 49/88]
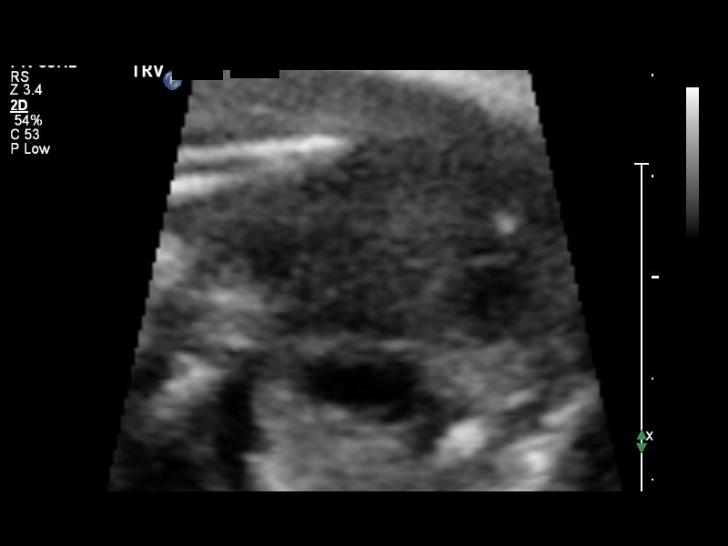
[im 55/88]
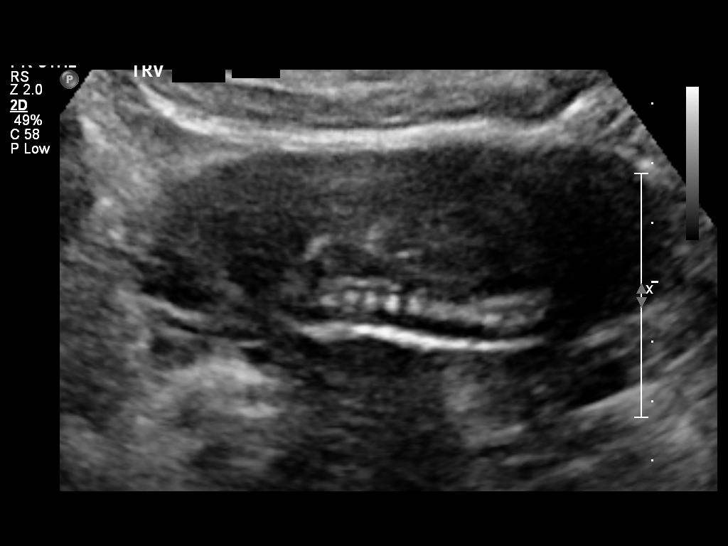
[im 62/88]
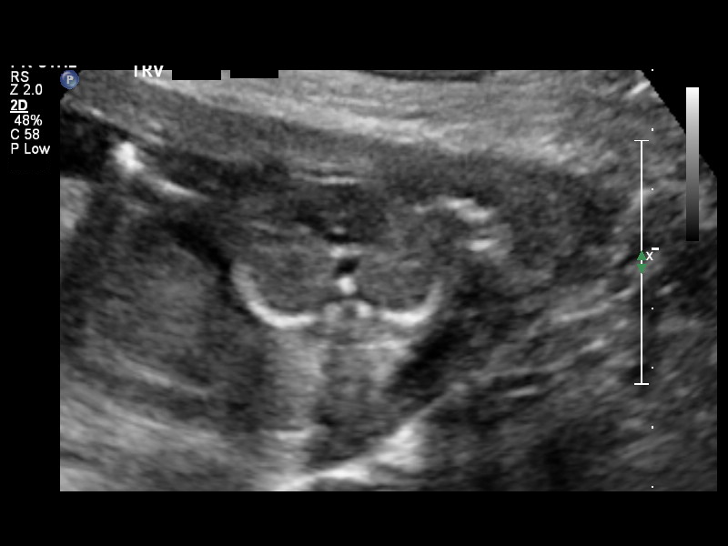
[im 71/88]
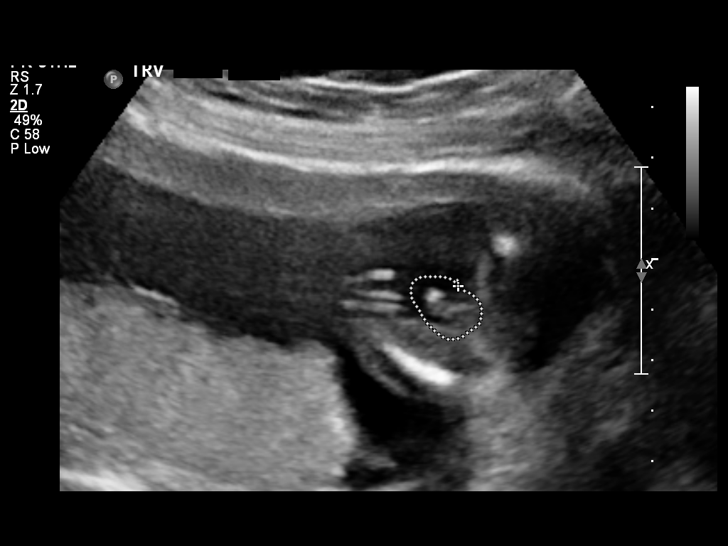
[im 78/88]
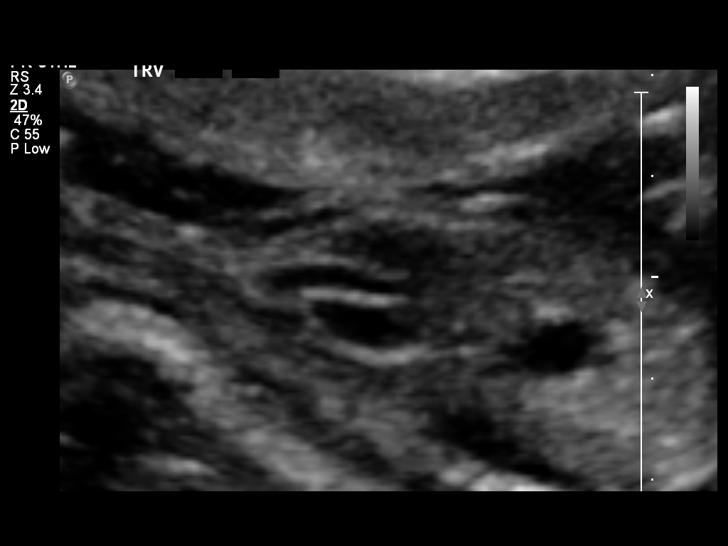
[im 84/88]
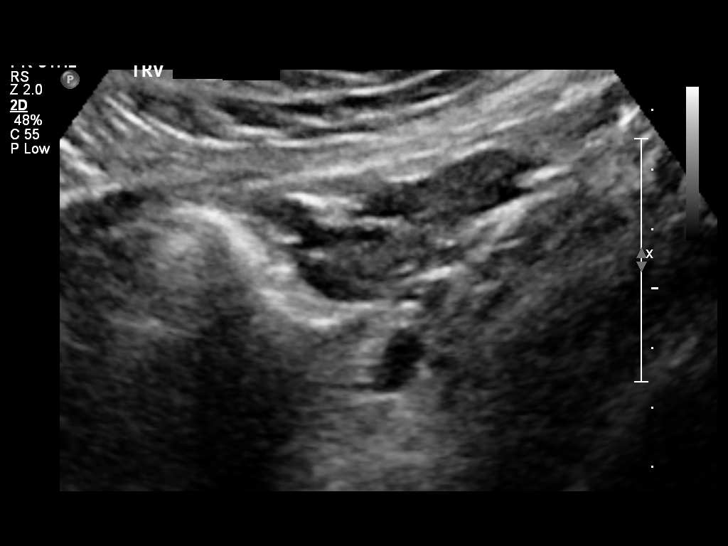

[12 of 28 positions shown; findings below may reference images not displayed]

OBSTETRICS REPORT
                      (Signed Final [DATE] [DATE])

Service(s) Provided

 US OB DETAIL + 14 WK                                  76811.0
Indications

 Detailed fetal anatomic survey                        655.83 [IC]
 Previous cesarean section
Fetal Evaluation

 Num Of Fetuses:    1
 Fetal Heart Rate:  139                         bpm
 Cardiac Activity:  Observed
 Presentation:      Variable
 Placenta:          Posterior, above cervical
                    os
 P. Cord            Visualized
 Insertion:

 Amniotic Fluid
 AFI FV:      Subjectively within normal limits
                                             Larg Pckt:     4.2  cm
Biometry

 BPD:       40  mm    G. Age:   18w 1d                CI:        71.12   70 - 86
                                                      FL/HC:      16.8   16.1 -

 HC:     151.1  mm    G. Age:   18w 1d       14  %    HC/AC:      1.19   1.09 -

 AC:     127.2  mm    G. Age:   18w 2d       28  %    FL/BPD:
 FL:      25.4  mm    G. Age:   17w 5d       10  %    FL/AC:      20.0   20 - 24
 HUM:     25.3  mm    G. Age:   18w 0d       24  %
 CER:     16.8  mm    G. Age:   16w 6d      < 5  %
 NFT:     3.83  mm

 Est. FW:     221  gm      0 lb 8 oz     29  %
Gestational Age

 LMP:           18w 6d       Date:   [DATE]                 EDD:   [DATE]
 U/S Today:     18w 0d                                        EDD:   [DATE]
 Best:          18w 6d    Det. By:   LMP  ([DATE])          EDD:   [DATE]
2nd Trimester Genetic Sonogram - Trisomy 21 Screening
 Age:                                             22          Risk=1:   885

 Structural anomalies (inc. cardiac):             No
 Echogenic bowel:                                 No
 Hypoplastic / absent midphalanx 5th Digit:       No
 Pyelectasis:                                     No
 2-vessel umbilical cord:                         No
 Echogenic cardiac foci:                          No
Anatomy

 Cranium:          Appears normal         Aortic Arch:      Appears normal
 Fetal Cavum:      Appears normal         Ductal Arch:      Appears normal
 Ventricles:       Appears normal         Diaphragm:        Appears normal
 Choroid Plexus:   Appears normal         Stomach:          Appears normal
 Cerebellum:       Appears normal         Abdomen:          Appears normal
 Posterior Fossa:  Appears normal         Abdominal Wall:   Appears nml (cord
                                                            insert, abd wall)
 Nuchal Fold:      Appears normal         Cord Vessels:     Appears normal (3
                                                            vessel cord)
 Face:             Appears normal         Kidneys:          Appear normal
                   (orbits and profile)
 Lips:             Appears normal         Bladder:          Appears normal
 Heart:            Appears normal         Spine:            Appears normal
                   (4CH, axis, and
                   situs)
 RVOT:             Appears normal         Lower             Appears normal
                                          Extremities:
 LVOT:             Not well visualized    Upper             Appears normal
                                          Extremities:

 Other:  Nasal bone visualized. Heels and 5th digit visualized. Male gender.
Targeted Anatomy

 Fetal Central Nervous System
 Cisterna Magna:
Cervix Uterus Adnexa

 Cervical Length:   3.1       cm

 Cervix:       Normal appearance by transabdominal scan.
 Left Ovary:   Within normal limits.
 Right Ovary:  Within normal limits.

 Adnexa:     No abnormality visualized.
Impression

 Single living IUP.  US EGA is concordant with LMP.
 No fetal anomalies seen involving visualized anatomy.
 No sonographic markers for aneuploidy visualized.

 questions or concerns.

## 2012-03-28 ENCOUNTER — Encounter: Admitting: Obstetrics & Gynecology

## 2012-04-02 ENCOUNTER — Ambulatory Visit (INDEPENDENT_AMBULATORY_CARE_PROVIDER_SITE_OTHER): Admitting: Family Medicine

## 2012-04-02 VITALS — BP 93/63 | Wt 164.0 lb

## 2012-04-02 DIAGNOSIS — Z3480 Encounter for supervision of other normal pregnancy, unspecified trimester: Secondary | ICD-10-CM

## 2012-04-02 DIAGNOSIS — N390 Urinary tract infection, site not specified: Secondary | ICD-10-CM

## 2012-04-02 LAB — POCT URINALYSIS DIPSTICK
Color, UA: NEGATIVE
Leukocytes, UA: NEGATIVE
Nitrite, UA: NEGATIVE
Protein, UA: NEGATIVE
Urobilinogen, UA: 0.2
pH, UA: 6

## 2012-04-02 NOTE — Patient Instructions (Addendum)
Fibrocystic Breast Changes Fibrocystic breast changes is a non-cancerous(benign) condition that about half of all women have at some time in their life. It is also called benign breast disease and mammary dysplasia. It may also be called fibrocystic breast disease, but it is not really a disease. It is a common condition that occurs when women go through the hormonal changes during their menstrual cycle, between the ages of 69 to 95. Menopausal women do not have this problem, unless they are on hormone therapy. It can affect one or both breasts. This is not a sign that you will later get cancer. CAUSES  Overgrowth of cells lining the milk ducts, or enlarged lobules in the breast, cause the breast duct to become blocked. The duct then fills up with fluid. This is like a small balloon filled with water. It is called a cyst. Over time, with repeated inflammation there is a tendency to form scar tissue. This scar tissue becomes the fibrous part of fibrocystic disease. The exact cause of this happening is not known, but it may be related to the female hormones, estrogen and progesterone. Heredity (genetics) may also be a factor in some cases. SYMPTOMS   Tenderness.  Swelling.  Rope-like feeling.  Lumpy breast, one or both sides.  Changes in the size of the breasts, before and after the menstrual period (larger before, smaller after).  Green or dark brown nipple discharge (not blood). Symptoms are usually worse before periods (menstrual cycle) and get better toward the end of menstruation. Usually, it is temporary minor discomfort. But some women have severe pain.  DIAGNOSIS  Check your breasts monthly. The best time to check your breasts is after your period. If you check them during your period, you are more likely to feel the normal glands enlarged, as a result of the hormonal changes that happen right before your period. If you do not have menstrual periods, check your breasts the first day of every  month. Become familiar with the way your own breasts feel. It is then easier to notice if there are changes, such as more tenderness, a new growth, change in breast size, or a change in a lump that has always been there. All breasts lumps need to be investigated, to rule out breast cancer. See your caregiver as soon as possible, if you find a lump. Most breast lumps are not cancerous. Excellent treatment is available for ones that are.  To make a diagnosis, your caregiver will examine your breasts and may recommend other tests, such as:  Mammogram (breast X-ray).  Ultrasound.  MRI (magnetic resonance imaging).  Removing fluid from the cyst with a fine needle, under local anesthesia (aspiration).  Taking a breast tissue sample (breast biopsy). Some questions your caregiver will ask are:  What was the date of your last period?  When did the lump show up?  Is there any discharge from your breast?  Is the breast tender or painful?  Are the symptoms in one or both breasts?  Has the lump changed in size from month-to-month? How long has it been present?  Any family history of breast problems?  Any past breast problems?  Any history of breast surgery?  Are you taking any medications?  When was your last mammogram, and where was it done? TREATMENT   Dietary changes help to prevent or reduce the symptoms of fibrocystic breast changes.  You may need to stop consuming all foods that contain caffeine, such as chocolate, sodas, coffee, and tea.  Reducing sugar and fat in your diet may also help.  Decrease estrogen in your diet. Some sources include commercially raised meats which contain estrogen. Eliminate other natural estrogens.  Birth control pills can also make symptoms worse.  Natural progesterone cream, applied at a dose of 15 to 20 milligrams per day, from ovulation until a day or two before your period returns, may help with returning to normal breast tissue over several  months. Seek advice from your caregiver.  Over-the-counter pain pills may help, as recommended by your caregiver.  Danazol hormone (female-like hormone) is sometimes used. It may cause hair growth and acne.  Needle aspiration can be used, to remove fluid from the cyst.  Surgery may be needed, to remove a large, persistent, and tender cyst.  Evening primrose oil may help with the tenderness and pain. It has linolenic acid that women may not have enough of. HOME CARE INSTRUCTIONS   Examine your breasts after every menstrual period.  If you do not have menstrual periods, examine your breasts the first day of every month.  Wear a firm support bra, especially when exercising.  Decrease or avoid caffeine in your diet.  Decrease the fat and sugar in your diet.  Eat a balanced diet.  Try to see your caregiver after you have a menstrual period.  Before seeing your caregiver, make notes about:  When you have the symptoms.  What types of symptoms you are having.  Medications you are taking.  When and where your last mammogram was taken.  Past breast problems or breast surgery. SEEK MEDICAL CARE IF:   You have been diagnosed with fibrocystic breast changes, and you develop changes in your breast:  Discharge from the nipple, especially bloody discharge.  Pain in the breast that does not go away after your menstrual period.  New lumps or bumps in the breast.  Lumps in your armpit.  Your breast or breasts become enlarged, red, and painful.  You find an isolated lump, even if it is not tender.  You have questions about this condition that have not been answered. Document Released: 11/16/2005 Document Revised: 04/24/2011 Document Reviewed: 02/10/2009 [facility] Patient Information 2013 [place], [place].  Pregnancy - Second Trimester The second trimester of pregnancy (3 to 6 months) is a period of rapid growth for you and your baby. At the end of the sixth month, your baby is  about 9 inches long and weighs 1 1/2 pounds. You will begin to feel the baby move between 18 and 20 weeks of the pregnancy. This is called quickening. Weight gain is faster. A clear fluid (colostrum) may leak out of your breasts. You may feel small contractions of the womb (uterus). This is known as false labor or Braxton-Hicks contractions. This is like a practice for labor when the baby is ready to be born. Usually, the problems with morning sickness have usually passed by the end of your first trimester. Some women develop small dark blotches (called cholasma, mask of pregnancy) on their face that usually goes away after the baby is born. Exposure to the sun makes the blotches worse. Acne may also develop in some pregnant women and pregnant women who have acne, may find that it goes away. PRENATAL EXAMS  Blood work may continue to be done during prenatal exams. These tests are done to check on your health and the probable health of your baby. Blood work is used to follow your blood levels (hemoglobin). Anemia (low hemoglobin) is common during pregnancy. Iron  and vitamins are given to help prevent this. You will also be checked for diabetes between 24 and 28 weeks of the pregnancy. Some of the previous blood tests may be repeated.  The size of the uterus is measured during each visit. This is to make sure that the baby is continuing to grow properly according to the dates of the pregnancy.  Your blood pressure is checked every prenatal visit. This is to make sure you are not getting toxemia.  Your urine is checked to make sure you do not have an infection, diabetes or protein in the urine.  Your weight is checked often to make sure gains are happening at the suggested rate. This is to ensure that both you and your baby are growing normally.  Sometimes, an ultrasound is performed to confirm the proper growth and development of the baby. This is a test which bounces harmless sound waves off the baby so  your caregiver can more accurately determine due dates. Sometimes, a specialized test is done on the amniotic fluid surrounding the baby. This test is called an amniocentesis. The amniotic fluid is obtained by sticking a needle into the belly (abdomen). This is done to check the chromosomes in instances where there is a concern about possible genetic problems with the baby. It is also sometimes done near the end of pregnancy if an early delivery is required. In this case, it is done to help make sure the baby's lungs are mature enough for the baby to live outside of the womb. CHANGES OCCURING IN THE SECOND TRIMESTER OF PREGNANCY Your body goes through many changes during pregnancy. They vary from person to person. Talk to your caregiver about changes you notice that you are concerned about.  During the second trimester, you will likely have an increase in your appetite. It is normal to have cravings for certain foods. This varies from person to person and pregnancy to pregnancy.  Your lower abdomen will begin to bulge.  You may have to urinate more often because the uterus and baby are pressing on your bladder. It is also common to get more bladder infections during pregnancy (pain with urination). You can help this by drinking lots of fluids and emptying your bladder before and after intercourse.  You may begin to get stretch marks on your hips, abdomen, and breasts. These are normal changes in the body during pregnancy. There are no exercises or medications to take that prevent this change.  You may begin to develop swollen and bulging veins (varicose veins) in your legs. Wearing support hose, elevating your feet for 15 minutes, 3 to 4 times a day and limiting salt in your diet helps lessen the problem.  Heartburn may develop as the uterus grows and pushes up against the stomach. Antacids recommended by your caregiver helps with this problem. Also, eating smaller meals 4 to 5 times a day  helps.  Constipation can be treated with a stool softener or adding bulk to your diet. Drinking lots of fluids, vegetables, fruits, and whole grains are helpful.  Exercising is also helpful. If you have been very active up until your pregnancy, most of these activities can be continued during your pregnancy. If you have been less active, it is helpful to start an exercise program such as walking.  Hemorrhoids (varicose veins in the rectum) may develop at the end of the second trimester. Warm sitz baths and hemorrhoid cream recommended by your caregiver helps hemorrhoid problems.  Backaches may develop during  this time of your pregnancy. Avoid heavy lifting, wear low heal shoes and practice good posture to help with backache problems.  Some pregnant women develop tingling and numbness of their hand and fingers because of swelling and tightening of ligaments in the wrist (carpel tunnel syndrome). This goes away after the baby is born.  As your breasts enlarge, you may have to get a bigger bra. Get a comfortable, cotton, support bra. Do not get a nursing bra until the last month of the pregnancy if you will be nursing the baby.  You may get a dark line from your belly button to the pubic area called the linea nigra.  You may develop rosy cheeks because of increase blood flow to the face.  You may develop spider looking lines of the face, neck, arms and chest. These go away after the baby is born. HOME CARE INSTRUCTIONS   It is extremely important to avoid all smoking, herbs, alcohol, and unprescribed drugs during your pregnancy. These chemicals affect the formation and growth of the baby. Avoid these chemicals throughout the pregnancy to ensure the delivery of a healthy infant.  Most of your home care instructions are the same as suggested for the first trimester of your pregnancy. Keep your caregiver's appointments. Follow your caregiver's instructions regarding medication use, exercise and  diet.  During pregnancy, you are providing food for you and your baby. Continue to eat regular, well-balanced meals. Choose foods such as meat, fish, milk and other low fat dairy products, vegetables, fruits, and whole-grain breads and cereals. Your caregiver will tell you of the ideal weight gain.  A physical sexual relationship may be continued up until near the end of pregnancy if there are no other problems. Problems could include early (premature) leaking of amniotic fluid from the membranes, vaginal bleeding, abdominal pain, or other medical or pregnancy problems.  Exercise regularly if there are no restrictions. Check with your caregiver if you are unsure of the safety of some of your exercises. The greatest weight gain will occur in the last 2 trimesters of pregnancy. Exercise will help you:  Control your weight.  Get you in shape for labor and delivery.  Lose weight after you have the baby.  Wear a good support or jogging bra for breast tenderness during pregnancy. This may help if worn during sleep. Pads or tissues may be used in the bra if you are leaking colostrum.  Do not use hot tubs, steam rooms or saunas throughout the pregnancy.  Wear your seat belt at all times when driving. This protects you and your baby if you are in an accident.  Avoid raw meat, uncooked cheese, cat litter boxes and soil used by cats. These carry germs that can cause birth defects in the baby.  The second trimester is also a good time to visit your dentist for your dental health if this has not been done yet. Getting your teeth cleaned is OK. Use a soft toothbrush. Brush gently during pregnancy.  It is easier to loose urine during pregnancy. Tightening up and strengthening the pelvic muscles will help with this problem. Practice stopping your urination while you are going to the bathroom. These are the same muscles you need to strengthen. It is also the muscles you would use as if you were trying to stop  from passing gas. You can practice tightening these muscles up 10 times a set and repeating this about 3 times per day. Once you know what muscles to tighten up,  do not perform these exercises during urination. It is more likely to contribute to an infection by backing up the urine.  Ask for help if you have financial, counseling or nutritional needs during pregnancy. Your caregiver will be able to offer counseling for these needs as well as refer you for other special needs.  Your skin may become oily. If so, wash your face with mild soap, use non-greasy moisturizer and oil or cream based makeup. MEDICATIONS AND DRUG USE IN PREGNANCY  Take prenatal vitamins as directed. The vitamin should contain 1 milligram of folic acid. Keep all vitamins out of reach of children. Only a couple vitamins or tablets containing iron may be fatal to a baby or young child when ingested.  Avoid use of all medications, including herbs, over-the-counter medications, not prescribed or suggested by your caregiver. Only take over-the-counter or prescription medicines for pain, discomfort, or fever as directed by your caregiver. Do not use aspirin.  Let your caregiver also know about herbs you may be using.  Alcohol is related to a number of birth defects. This includes fetal alcohol syndrome. All alcohol, in any form, should be avoided completely. Smoking will cause low birth rate and premature babies.  Street or illegal drugs are very harmful to the baby. They are absolutely forbidden. A baby born to an addicted mother will be addicted at birth. The baby will go through the same withdrawal an adult does. SEEK MEDICAL CARE IF:  You have any concerns or worries during your pregnancy. It is better to call with your questions if you feel they cannot wait, rather than worry about them. SEEK IMMEDIATE MEDICAL CARE IF:   An unexplained oral temperature above 102 F (38.9 C) develops, or as your caregiver suggests.  You have  leaking of fluid from the vagina (birth canal). If leaking membranes are suspected, take your temperature and tell your caregiver of this when you call.  There is vaginal spotting, bleeding, or passing clots. Tell your caregiver of the amount and how many pads are used. Light spotting in pregnancy is common, especially following intercourse.  You develop a bad smelling vaginal discharge with a change in the color from clear to white.  You continue to feel sick to your stomach (nauseated) and have no relief from remedies suggested. You vomit blood or coffee ground-like materials.  You lose more than 2 pounds of weight or gain more than 2 pounds of weight over 1 week, or as suggested by your caregiver.  You notice swelling of your face, hands, feet, or legs.  You get exposed to [place] measles and have never had them.  You are exposed to fifth disease or chickenpox.  You develop belly (abdominal) pain. Round ligament discomfort is a common non-cancerous (benign) cause of abdominal pain in pregnancy. Your caregiver still must evaluate you.  You develop a bad headache that does not go away.  You develop fever, diarrhea, pain with urination, or shortness of breath.  You develop visual problems, blurry, or double vision.  You fall or are in a car accident or any kind of trauma.  There is mental or physical violence at home. Document Released: 01/24/2001 Document Revised: 04/24/2011 Document Reviewed: 07/29/2008 [facility] Patient Information 2013 [place], [place].  Breastfeeding Deciding to breastfeed is one of the best choices you can make for you and your baby. The information that follows gives a brief overview of the benefits of breastfeeding as well as common topics surrounding breastfeeding. BENEFITS OF BREASTFEEDING For the  baby  The first milk (colostrum) helps the baby's digestive system function better.   There are antibodies in the mother's milk that help the baby fight off  infections.   The baby has a lower incidence of asthma, allergies, and sudden infant death syndrome (SIDS).   The nutrients in breast milk are better for the baby than infant formulas, and breast milk helps the baby's brain grow better.   Babies who breastfeed have less gas, colic, and constipation.  For the mother  Breastfeeding helps develop a very special bond between the mother and her baby.   Breastfeeding is convenient, always available at the correct temperature, and costs nothing.   Breastfeeding burns calories in the mother and helps her lose weight that was gained during pregnancy.   Breastfeeding makes the uterus contract back down to normal size faster and slows bleeding following delivery.   Breastfeeding mothers have a lower risk of developing breast cancer.  BREASTFEEDING FREQUENCY  A healthy, full-term baby may breastfeed as often as every hour or space his or her feedings to every 3 hours.   Watch your baby for signs of hunger. Nurse your baby if he or she shows signs of hunger. How often you nurse will vary from baby to baby.   Nurse as often as the baby requests, or when you feel the need to reduce the fullness of your breasts.   Awaken the baby if it has been 3 4 hours since the last feeding.   Frequent feeding will help the mother make more milk and will help prevent problems, such as sore nipples and engorgement of the breasts.  BABY'S POSITION AT THE BREAST  Whether lying down or sitting, be sure that the baby's tummy is facing your tummy.   Support the breast with 4 fingers underneath the breast and the thumb above. Make sure your fingers are well away from the nipple and baby's mouth.   Stroke the baby's lips gently with your finger or nipple.   When the baby's mouth is open wide enough, place all of your nipple and as much of the areola as possible into your baby's mouth.   Pull the baby in close so the tip of the nose and the baby's  cheeks touch the breast during the feeding.  FEEDINGS AND SUCTION  The length of each feeding varies from baby to baby and from feeding to feeding.   The baby must suck about 2 3 minutes for your milk to get to him or her. This is called a "let down." For this reason, allow the baby to feed on each breast as long as he or she wants. Your baby will end the feeding when he or she has received the right balance of nutrients.   To break the suction, put your finger into the corner of the baby's mouth and slide it between his or her gums before removing your breast from his or her mouth. This will help prevent sore nipples.  HOW TO TELL WHETHER YOUR BABY IS GETTING ENOUGH BREAST MILK. Wondering whether or not your baby is getting enough milk is a common concern among mothers. You can be assured that your baby is getting enough milk if:   Your baby is actively sucking and you hear swallowing.   Your baby seems relaxed and satisfied after a feeding.   Your baby nurses at least 8 12 times in a 24 hour time period. Nurse your baby until he or she unlatches or falls  asleep at the first breast (at least 10 20 minutes), then offer the second side.   Your baby is wetting 5 6 disposable diapers (6 8 cloth diapers) in a 24 hour period by 58 40 days of age.   Your baby is having at least 3 4 stools every 24 hours for the first 6 weeks. The stool should be soft and yellow.   Your baby should gain 4 7 ounces per week after he or she is 82 days old.   Your breasts feel softer after nursing.  REDUCING BREAST ENGORGEMENT  In the first week after your baby is born, you may experience signs of breast engorgement. When breasts are engorged, they feel heavy, warm, full, and may be tender to the touch. You can reduce engorgement if you:   Nurse frequently, every 2 3 hours. Mothers who breastfeed early and often have fewer problems with engorgement.   Place light ice packs on your breasts for 10 20  minutes between feedings. This reduces swelling. Wrap the ice packs in a lightweight towel to protect your skin. Bags of frozen vegetables work well for this purpose.   Take a warm shower or apply warm, moist heat to your breast for 5 10 minutes just before each feeding. This increases circulation and helps the milk flow.   Gently massage your breast before and during the feeding. Using your finger tips, massage from the chest wall towards your nipple in a circular motion.   Make sure that the baby empties at least one breast at every feeding before switching sides.   Use a breast pump to empty the breasts if your baby is sleepy or not nursing well. You may also want to pump if you are returning to work oryou feel you are getting engorged.   Avoid bottle feeds, pacifiers, or supplemental feedings of water or juice in place of breastfeeding. Breast milk is all the food your baby needs. It is not necessary for your baby to have water or formula. In fact, to help your breasts make more milk, it is best not to give your baby supplemental feedings during the early weeks.   Be sure the baby is latched on and positioned properly while breastfeeding.   Wear a supportive bra, avoiding underwire styles.   Eat a balanced diet with enough fluids.   Rest often, relax, and take your prenatal vitamins to prevent fatigue, stress, and anemia.  If you follow these suggestions, your engorgement should improve in 24 48 hours. If you are still experiencing difficulty, call your lactation consultant or caregiver.  CARING FOR YOURSELF Take care of your breasts  Bathe or shower daily.   Avoid using soap on your nipples.   Start feedings on your left breast at one feeding and on your right breast at the next feeding.   You will notice an increase in your milk supply 2 5 days after delivery. You may feel some discomfort from engorgement, which makes your breasts very firm and often tender.  Engorgement "peaks" out within 24 48 hours. In the meantime, apply warm moist towels to your breasts for 5 10 minutes before feeding. Gentle massage and expression of some milk before feeding will soften your breasts, making it easier for your baby to latch on.   Wear a well-fitting nursing bra, and air dry your nipples for a 3 after each feeding.   Only use cotton bra pads.   Only use pure lanolin on your nipples after nursing. You  do not need to wash it off before feeding the baby again. Another option is to express a few drops of breast milk and gently massage it into your nipples.  Take care of yourself  Eat well-balanced meals and nutritious snacks.   Drinking milk, fruit juice, and water to satisfy your thirst (about 8 glasses a day).   Get plenty of rest.  Avoid foods that you notice affect the baby in a bad way.  SEEK MEDICAL CARE IF:   You have difficulty with breastfeeding and need help.   You have a hard, red, sore area on your breast that is accompanied by a fever.   Your baby is too sleepy to eat well or is having trouble sleeping.   Your baby is wetting less than 6 diapers a day, by 69 days of age.   Your baby's skin or white part of his or her eyes is more yellow than it was in the hospital.   You feel depressed.  Document Released: 01/30/2005 Document Revised: 08/01/2011 Document Reviewed: 04/30/2011 [facility] Patient Information 2013 [place], [place]. Vaginal Birth After Cesarean Delivery Vaginal birth after Cesarean delivery (VBAC) is giving birth vaginally after previously delivering a baby by a cesarean. In the past, if a woman had a Cesarean delivery, all births afterwards would be done by Cesarean delivery. This is no longer true. It can be safe for the mother to try a vaginal delivery after having a Cesarean. The final decision to have a VBAC or repeat Cesarean delivery should be between the patient and her caregiver. The risks and benefits  can be discussed relative to the reason for, and the type of the previous Cesarean delivery. WOMEN WHO PLAN TO HAVE A VBAC SHOULD CHECK WITH THEIR DOCTOR TO BE SURE THAT:  The previous Cesarean was done with a low transverse uterine incision (not a vertical classical incision).  The birth canal is big enough for the baby.  There were no other operations on the uterus.  They will have an electronic fetal monitor (EFM) on at all times during labor.  An operating room would be available and ready in case an emergency Cesarean is needed.  A doctor and surgical nursing staff would be available at all times during labor to be ready to do an emergency Cesarean if necessary.  An anesthesiologist would be present in case an emergency Cesarean is needed.  The nursery is prepared and has adequate personnel and necessary equipment available to care for the baby in case of an emergency Cesarean. BENEFITS OF VBAC:  Shorter stay in the hospital.  Lower delivery, nursery and hospital costs.  Less blood loss and need for blood transfusions.  Less fever and discomfort from major surgery.  Lower risk of blood clots.  Lower risk of infection.  Shorter recovery after going home.  Lower risk of other surgical complications, such as opening of the incision or hernia in the incision.  Decreased risk of injury to other organs.  Decreased risk for having to remove the uterus (hysterectomy).  Decreased risk for the placenta to completely or partially cover the opening of the uterus (placenta previa) with a future pregnancy.  Ability to have a larger family if desired. RISKS OF A VBAC:  Rupture of the uterus.  Having to remove the uterus (hysterectomy) if it ruptures.  All the complications of major surgery and/or injury to other organs.  Excessive bleeding, blood clots and infection.  Lower Apgar scores (method to evaluate the newborn based on  appearance, pulse, grimace, activity, and  respiration) and more risks to the baby.  There is a higher risk of uterine rupture if you induce or augment labor.  There is a higher risk of uterine rupture if you use medications to ripen the cervix. VBAC SHOULD NOT BE DONE IF:  The previous Cesarean was done with a vertical (classical) or T-shaped incision, or you do not know what kind of an incision was made.  You had a ruptured uterus.  You had surgery on your uterus.  You have medical or obstetrical problems.  There are problems with the baby.  There were two previous Cesarean deliveries and no vaginal deliveries. OTHER FACTS TO KNOW ABOUT VBAC:  It is safe to have an epidural anesthetic with VBAC.  It is safe to turn the baby from a breech position (attempt an external cephalic version).  It is safe to try a VBAC with twins.  Pregnancies later than 40 weeks have not been successful with VBAC.  There is an increased failure rate of a VBAC in obese pregnant women.  There is an increased failure rate with VABC if the baby weighs 8.8 pounds (4000 grams) or more.  There is an increased failure rate if the time between the Cesarean and VBAC is less than 19 months.  There is an increased failure rate if pre-eclampsia is present (high blood pressure, protein in the urine and swelling of face and extremities).  VBAC is very successful if there was a previous vaginal birth.  VBAC is very successful when the labor starts spontaneously before the due date.  Delivery of VBAC is similar to having a normal spontaneous vaginal delivery. It is important to discuss VBAC with your caregiver early in the pregnancy so you can understand the risks, benefits and options. It will give you time to decide what is best in your particular case relevant to the reason for your previous Cesarean delivery. It should be understood that medical changes in the mother or pregnancy may occur during the pregnancy, which make it necessary to change you or  your caregiver's initial decision. The counseling, concerns and decisions should be documented in the medical record and signed by all parties. Document Released: 07/23/2006 Document Revised: 04/24/2011 Document Reviewed: 03/13/2008 [facility] Patient Information 2013 [place], [place].

## 2012-04-02 NOTE — Progress Notes (Signed)
Nml anatomy Not feeling great movement Has band of tissue in left breast, that is mobile and feels like a fibrocystic change--if gets bigger will get u/s.

## 2012-04-04 LAB — CULTURE, URINE COMPREHENSIVE

## 2012-04-09 ENCOUNTER — Ambulatory Visit (INDEPENDENT_AMBULATORY_CARE_PROVIDER_SITE_OTHER): Admitting: Family Medicine

## 2012-04-09 VITALS — BP 93/69 | Wt 163.4 lb

## 2012-04-09 DIAGNOSIS — O26899 Other specified pregnancy related conditions, unspecified trimester: Secondary | ICD-10-CM

## 2012-04-09 DIAGNOSIS — O4700 False labor before 37 completed weeks of gestation, unspecified trimester: Secondary | ICD-10-CM

## 2012-04-09 DIAGNOSIS — O26892 Other specified pregnancy related conditions, second trimester: Secondary | ICD-10-CM

## 2012-04-09 DIAGNOSIS — Z3480 Encounter for supervision of other normal pregnancy, unspecified trimester: Secondary | ICD-10-CM

## 2012-04-09 DIAGNOSIS — R300 Dysuria: Secondary | ICD-10-CM

## 2012-04-09 LAB — POCT URINALYSIS DIPSTICK
Ketones, UA: NEGATIVE
Spec Grav, UA: 1.015
Urobilinogen, UA: 0.2
pH, UA: 6.5

## 2012-04-09 MED ORDER — NITROFURANTOIN MONOHYD MACRO 100 MG PO CAPS: 100.0000 mg | ORAL_CAPSULE | Freq: Two times a day (BID) | ORAL | Status: DC

## 2012-04-09 NOTE — Progress Notes (Signed)
Having urinary frequency, with pain on right side and back. Losing vision periodically in her right eye.  Having some dizzy spells. She is concerned that she still has not felt baby move. Has shortness of breath when her back starts hurting.

## 2012-04-09 NOTE — Progress Notes (Signed)
Reports urinary urgency, freguency, dysuria--neg urine cx last week, will treat presumptively. Still with tr blood in urine.  Also, concerned it may be stones--discussed likely treatment with hydration. Reports losing vision in her right eye and seeing an eye MD, and being told to see Neurology.  No PCP at present.--will refer Cervix is firm and feels unlabored.--No s/sx's of PTL.

## 2012-04-09 NOTE — Patient Instructions (Addendum)
Pregnancy - Second Trimester The second trimester of pregnancy (3 to 6 months) is a period of rapid growth for you and your baby. At the end of the sixth month, your baby is about 9 inches long and weighs 1 1/2 pounds. You will begin to feel the baby move between 18 and 20 weeks of the pregnancy. This is called quickening. Weight gain is faster. A clear fluid (colostrum) may leak out of your breasts. You may feel small contractions of the womb (uterus). This is known as false labor or Braxton-Hicks contractions. This is like a practice for labor when the baby is ready to be born. Usually, the problems with morning sickness have usually passed by the end of your first trimester. Some women develop small dark blotches (called cholasma, mask of pregnancy) on their face that usually goes away after the baby is born. Exposure to the sun makes the blotches worse. Acne may also develop in some pregnant women and pregnant women who have acne, may find that it goes away. PRENATAL EXAMS  Blood work may continue to be done during prenatal exams. These tests are done to check on your health and the probable health of your baby. Blood work is used to follow your blood levels (hemoglobin). Anemia (low hemoglobin) is common during pregnancy. Iron and vitamins are given to help prevent this. You will also be checked for diabetes between 24 and 28 weeks of the pregnancy. Some of the previous blood tests may be repeated.  The size of the uterus is measured during each visit. This is to make sure that the baby is continuing to grow properly according to the dates of the pregnancy.  Your blood pressure is checked every prenatal visit. This is to make sure you are not getting toxemia.  Your urine is checked to make sure you do not have an infection, diabetes or protein in the urine.  Your weight is checked often to make sure gains are happening at the suggested rate. This is to ensure that both you and your baby are  growing normally.  Sometimes, an ultrasound is performed to confirm the proper growth and development of the baby. This is a test which bounces harmless sound waves off the baby so your caregiver can more accurately determine due dates. Sometimes, a specialized test is done on the amniotic fluid surrounding the baby. This test is called an amniocentesis. The amniotic fluid is obtained by sticking a needle into the belly (abdomen). This is done to check the chromosomes in instances where there is a concern about possible genetic problems with the baby. It is also sometimes done near the end of pregnancy if an early delivery is required. In this case, it is done to help make sure the baby's lungs are mature enough for the baby to live outside of the womb. CHANGES OCCURING IN THE SECOND TRIMESTER OF PREGNANCY Your body goes through many changes during pregnancy. They vary from person to person. Talk to your caregiver about changes you notice that you are concerned about.  During the second trimester, you will likely have an increase in your appetite. It is normal to have cravings for certain foods. This varies from person to person and pregnancy to pregnancy.  Your lower abdomen will begin to bulge.  You may have to urinate more often because the uterus and baby are pressing on your bladder. It is also common to get more bladder infections during pregnancy (pain with urination). You can help this by  drinking lots of fluids and emptying your bladder before and after intercourse.  You may begin to get stretch marks on your hips, abdomen, and breasts. These are normal changes in the body during pregnancy. There are no exercises or medications to take that prevent this change.  You may begin to develop swollen and bulging veins (varicose veins) in your legs. Wearing support hose, elevating your feet for 15 minutes, 3 to 4 times a day and limiting salt in your diet helps lessen the problem.  Heartburn may  develop as the uterus grows and pushes up against the stomach. Antacids recommended by your caregiver helps with this problem. Also, eating smaller meals 4 to 5 times a day helps.  Constipation can be treated with a stool softener or adding bulk to your diet. Drinking lots of fluids, vegetables, fruits, and whole grains are helpful.  Exercising is also helpful. If you have been very active up until your pregnancy, most of these activities can be continued during your pregnancy. If you have been less active, it is helpful to start an exercise program such as walking.  Hemorrhoids (varicose veins in the rectum) may develop at the end of the second trimester. Warm sitz baths and hemorrhoid cream recommended by your caregiver helps hemorrhoid problems.  Backaches may develop during this time of your pregnancy. Avoid heavy lifting, wear low heal shoes and practice good posture to help with backache problems.  Some pregnant women develop tingling and numbness of their hand and fingers because of swelling and tightening of ligaments in the wrist (carpel tunnel syndrome). This goes away after the baby is born.  As your breasts enlarge, you may have to get a bigger bra. Get a comfortable, cotton, support bra. Do not get a nursing bra until the last month of the pregnancy if you will be nursing the baby.  You may get a dark line from your belly button to the pubic area called the linea nigra.  You may develop rosy cheeks because of increase blood flow to the face.  You may develop spider looking lines of the face, neck, arms and chest. These go away after the baby is born. HOME CARE INSTRUCTIONS   It is extremely important to avoid all smoking, herbs, alcohol, and unprescribed drugs during your pregnancy. These chemicals affect the formation and growth of the baby. Avoid these chemicals throughout the pregnancy to ensure the delivery of a healthy infant.  Most of your home care instructions are the same  as suggested for the first trimester of your pregnancy. Keep your caregiver's appointments. Follow your caregiver's instructions regarding medication use, exercise and diet.  During pregnancy, you are providing food for you and your baby. Continue to eat regular, well-balanced meals. Choose foods such as meat, fish, milk and other low fat dairy products, vegetables, fruits, and whole-grain breads and cereals. Your caregiver will tell you of the ideal weight gain.  A physical sexual relationship may be continued up until near the end of pregnancy if there are no other problems. Problems could include early (premature) leaking of amniotic fluid from the membranes, vaginal bleeding, abdominal pain, or other medical or pregnancy problems.  Exercise regularly if there are no restrictions. Check with your caregiver if you are unsure of the safety of some of your exercises. The greatest weight gain will occur in the last 2 trimesters of pregnancy. Exercise will help you:  Control your weight.  Get you in shape for labor and delivery.  Lose weight  after you have the baby.  Wear a good support or jogging bra for breast tenderness during pregnancy. This may help if worn during sleep. Pads or tissues may be used in the bra if you are leaking colostrum.  Do not use hot tubs, steam rooms or saunas throughout the pregnancy.  Wear your seat belt at all times when driving. This protects you and your baby if you are in an accident.  Avoid raw meat, uncooked cheese, cat litter boxes and soil used by cats. These carry germs that can cause birth defects in the baby.  The second trimester is also a good time to visit your dentist for your dental health if this has not been done yet. Getting your teeth cleaned is OK. Use a soft toothbrush. Brush gently during pregnancy.  It is easier to loose urine during pregnancy. Tightening up and strengthening the pelvic muscles will help with this problem. Practice stopping  your urination while you are going to the bathroom. These are the same muscles you need to strengthen. It is also the muscles you would use as if you were trying to stop from passing gas. You can practice tightening these muscles up 10 times a set and repeating this about 3 times per day. Once you know what muscles to tighten up, do not perform these exercises during urination. It is more likely to contribute to an infection by backing up the urine.  Ask for help if you have financial, counseling or nutritional needs during pregnancy. Your caregiver will be able to offer counseling for these needs as well as refer you for other special needs.  Your skin may become oily. If so, wash your face with mild soap, use non-greasy moisturizer and oil or cream based makeup. MEDICATIONS AND DRUG USE IN PREGNANCY  Take prenatal vitamins as directed. The vitamin should contain 1 milligram of folic acid. Keep all vitamins out of reach of children. Only a couple vitamins or tablets containing iron may be fatal to a baby or young child when ingested.  Avoid use of all medications, including herbs, over-the-counter medications, not prescribed or suggested by your caregiver. Only take over-the-counter or prescription medicines for pain, discomfort, or fever as directed by your caregiver. Do not use aspirin.  Let your caregiver also know about herbs you may be using.  Alcohol is related to a number of birth defects. This includes fetal alcohol syndrome. All alcohol, in any form, should be avoided completely. Smoking will cause low birth rate and premature babies.  Street or illegal drugs are very harmful to the baby. They are absolutely forbidden. A baby born to an addicted mother will be addicted at birth. The baby will go through the same withdrawal an adult does. SEEK MEDICAL CARE IF:  You have any concerns or worries during your pregnancy. It is better to call with your questions if you feel they cannot wait,  rather than worry about them. SEEK IMMEDIATE MEDICAL CARE IF:   An unexplained oral temperature above 102 F (38.9 C) develops, or as your caregiver suggests.  You have leaking of fluid from the vagina (birth canal). If leaking membranes are suspected, take your temperature and tell your caregiver of this when you call.  There is vaginal spotting, bleeding, or passing clots. Tell your caregiver of the amount and how many pads are used. Light spotting in pregnancy is common, especially following intercourse.  You develop a bad smelling vaginal discharge with a change in the color from clear  to white.  You continue to feel sick to your stomach (nauseated) and have no relief from remedies suggested. You vomit blood or coffee ground-like materials.  You lose more than 2 pounds of weight or gain more than 2 pounds of weight over 1 week, or as suggested by your caregiver.  You notice swelling of your face, hands, feet, or legs.  You get exposed to [place] measles and have never had them.  You are exposed to fifth disease or chickenpox.  You develop belly (abdominal) pain. Round ligament discomfort is a common non-cancerous (benign) cause of abdominal pain in pregnancy. Your caregiver still must evaluate you.  You develop a bad headache that does not go away.  You develop fever, diarrhea, pain with urination, or shortness of breath.  You develop visual problems, blurry, or double vision.  You fall or are in a car accident or any kind of trauma.  There is mental or physical violence at home. Document Released: 01/24/2001 Document Revised: 04/24/2011 Document Reviewed: 07/29/2008 [facility] Patient Information 2013 [place], [place].  Breastfeeding Deciding to breastfeed is one of the best choices you can make for you and your baby. The information that follows gives a brief overview of the benefits of breastfeeding as well as common topics surrounding breastfeeding. BENEFITS OF  BREASTFEEDING For the baby  The first milk (colostrum) helps the baby's digestive system function better.   There are antibodies in the mother's milk that help the baby fight off infections.   The baby has a lower incidence of asthma, allergies, and sudden infant death syndrome (SIDS).   The nutrients in breast milk are better for the baby than infant formulas, and breast milk helps the baby's brain grow better.   Babies who breastfeed have less gas, colic, and constipation.  For the mother  Breastfeeding helps develop a very special bond between the mother and her baby.   Breastfeeding is convenient, always available at the correct temperature, and costs nothing.   Breastfeeding burns calories in the mother and helps her lose weight that was gained during pregnancy.   Breastfeeding makes the uterus contract back down to normal size faster and slows bleeding following delivery.   Breastfeeding mothers have a lower risk of developing breast cancer.  BREASTFEEDING FREQUENCY  A healthy, full-term baby may breastfeed as often as every hour or space his or her feedings to every 3 hours.   Watch your baby for signs of hunger. Nurse your baby if he or she shows signs of hunger. How often you nurse will vary from baby to baby.   Nurse as often as the baby requests, or when you feel the need to reduce the fullness of your breasts.   Awaken the baby if it has been 3 4 hours since the last feeding.   Frequent feeding will help the mother make more milk and will help prevent problems, such as sore nipples and engorgement of the breasts.  BABY'S POSITION AT THE BREAST  Whether lying down or sitting, be sure that the baby's tummy is facing your tummy.   Support the breast with 4 fingers underneath the breast and the thumb above. Make sure your fingers are well away from the nipple and baby's mouth.   Stroke the baby's lips gently with your finger or nipple.   When the  baby's mouth is open wide enough, place all of your nipple and as much of the areola as possible into your baby's mouth.   Pull the baby in  close so the tip of the nose and the baby's cheeks touch the breast during the feeding.  FEEDINGS AND SUCTION  The length of each feeding varies from baby to baby and from feeding to feeding.   The baby must suck about 2 3 minutes for your milk to get to him or her. This is called a "let down." For this reason, allow the baby to feed on each breast as long as he or she wants. Your baby will end the feeding when he or she has received the right balance of nutrients.   To break the suction, put your finger into the corner of the baby's mouth and slide it between his or her gums before removing your breast from his or her mouth. This will help prevent sore nipples.  HOW TO TELL WHETHER YOUR BABY IS GETTING ENOUGH BREAST MILK. Wondering whether or not your baby is getting enough milk is a common concern among mothers. You can be assured that your baby is getting enough milk if:   Your baby is actively sucking and you hear swallowing.   Your baby seems relaxed and satisfied after a feeding.   Your baby nurses at least 8 12 times in a 24 hour time period. Nurse your baby until he or she unlatches or falls asleep at the first breast (at least 10 20 minutes), then offer the second side.   Your baby is wetting 5 6 disposable diapers (6 8 cloth diapers) in a 24 hour period by 43 97 days of age.   Your baby is having at least 3 4 stools every 24 hours for the first 6 weeks. The stool should be soft and yellow.   Your baby should gain 4 7 ounces per week after he or she is 23 days old.   Your breasts feel softer after nursing.  REDUCING BREAST ENGORGEMENT  In the first week after your baby is born, you may experience signs of breast engorgement. When breasts are engorged, they feel heavy, warm, full, and may be tender to the touch. You can reduce  engorgement if you:   Nurse frequently, every 2 3 hours. Mothers who breastfeed early and often have fewer problems with engorgement.   Place light ice packs on your breasts for 10 20 minutes between feedings. This reduces swelling. Wrap the ice packs in a lightweight towel to protect your skin. Bags of frozen vegetables work well for this purpose.   Take a warm shower or apply warm, moist heat to your breast for 5 10 minutes just before each feeding. This increases circulation and helps the milk flow.   Gently massage your breast before and during the feeding. Using your finger tips, massage from the chest wall towards your nipple in a circular motion.   Make sure that the baby empties at least one breast at every feeding before switching sides.   Use a breast pump to empty the breasts if your baby is sleepy or not nursing well. You may also want to pump if you are returning to work oryou feel you are getting engorged.   Avoid bottle feeds, pacifiers, or supplemental feedings of water or juice in place of breastfeeding. Breast milk is all the food your baby needs. It is not necessary for your baby to have water or formula. In fact, to help your breasts make more milk, it is best not to give your baby supplemental feedings during the early weeks.   Be sure the baby is latched  on and positioned properly while breastfeeding.   Wear a supportive bra, avoiding underwire styles.   Eat a balanced diet with enough fluids.   Rest often, relax, and take your prenatal vitamins to prevent fatigue, stress, and anemia.  If you follow these suggestions, your engorgement should improve in 24 48 hours. If you are still experiencing difficulty, call your lactation consultant or caregiver.  CARING FOR YOURSELF Take care of your breasts  Bathe or shower daily.   Avoid using soap on your nipples.   Start feedings on your left breast at one feeding and on your right breast at the next  feeding.   You will notice an increase in your milk supply 2 5 days after delivery. You may feel some discomfort from engorgement, which makes your breasts very firm and often tender. Engorgement "peaks" out within 24 48 hours. In the meantime, apply warm moist towels to your breasts for 5 10 minutes before feeding. Gentle massage and expression of some milk before feeding will soften your breasts, making it easier for your baby to latch on.   Wear a well-fitting nursing bra, and air dry your nipples for a 3 after each feeding.   Only use cotton bra pads.   Only use pure lanolin on your nipples after nursing. You do not need to wash it off before feeding the baby again. Another option is to express a few drops of breast milk and gently massage it into your nipples.  Take care of yourself  Eat well-balanced meals and nutritious snacks.   Drinking milk, fruit juice, and water to satisfy your thirst (about 8 glasses a day).   Get plenty of rest.  Avoid foods that you notice affect the baby in a bad way.  SEEK MEDICAL CARE IF:   You have difficulty with breastfeeding and need help.   You have a hard, red, sore area on your breast that is accompanied by a fever.   Your baby is too sleepy to eat well or is having trouble sleeping.   Your baby is wetting less than 6 diapers a day, by 3 days of age.   Your baby's skin or white part of his or her eyes is more yellow than it was in the hospital.   You feel depressed.  Document Released: 01/30/2005 Document Revised: 08/01/2011 Document Reviewed: 04/30/2011 [facility] Patient Information 2013 [place], [place]. Round Ligament Pain The round ligament is made up of muscle and fibrous tissue. It is attached to the uterus near the fallopian tube. The round ligament is located on both sides of the uterus and helps support the position of the uterus. It usually begins in the second trimester of pregnancy when the uterus comes out  of the pelvis. The pain can come and go until the baby is delivered. Round ligament pain is not a serious problem and does not cause harm to the baby. CAUSE During pregnancy the uterus grows the most from the second trimester to delivery. As it grows, it stretches and slightly twists the round ligaments. When the uterus leans from one side to the other, the round ligament on the opposite side pulls and stretches. This can cause pain. SYMPTOMS  Pain can occur on one side or both sides. The pain is usually a short, sharp, and pinching-like. Sometimes it can be a dull, lingering and aching pain. The pain is located in the lower side of the abdomen or in the groin. The pain is internal and usually starts deep  in the groin and moves up to the outside of the hip area. Pain can occur with:  Sudden change in position like getting out of bed or a chair.  Rolling over in bed.  Coughing or sneezing.  Walking too much.  Any type of physical activity. DIAGNOSIS  Your caregiver will make sure there are no serious problems causing the pain. When nothing serious is found, the symptoms usually indicate that the pain is from the round ligament. TREATMENT   Sit down and relax when the pain starts.  Flex your knees up to your belly.  Lay on your side with a pillow under your belly (abdomen) and another one between your legs.  Sit in a hot bath for 15 to 20 minutes or until the pain goes away. HOME CARE INSTRUCTIONS   Only take over-the-counter or prescriptions medicines for pain, discomfort or fever as directed by your caregiver.  Sit and stand slowly.  Avoid long walks if it causes pain.  Stop or lessen your physical activities if it causes pain. SEEK MEDICAL CARE IF:   The pain does not go away with any of your treatment.  You need stronger medication for the pain.  You develop back pain that you did not have before with the side pain. SEEK IMMEDIATE MEDICAL CARE IF:   You develop a  temperature of 102 F (38.9 C) or higher.  You develop uterine contractions.  You develop vaginal bleeding.  You develop nausea, vomiting or diarrhea.  You develop chills.  You have pain when you urinate. Document Released: 11/09/2007 Document Revised: 04/24/2011 Document Reviewed: 11/09/2007 [facility] Patient Information 2013 [place], [place]. Kidney Stones Kidney stones (ureteral lithiasis) are deposits that form inside your kidneys. The intense pain is caused by the stone moving through the urinary tract. When the stone moves, the ureter goes into spasm around the stone. The stone is usually passed in the urine.  CAUSES   A disorder that makes certain neck glands produce too much parathyroid hormone (primary hyperparathyroidism).  A buildup of uric acid crystals.  Narrowing (stricture) of the ureter.  A kidney obstruction present at birth (congenital obstruction).  Previous surgery on the kidney or ureters.  Numerous kidney infections. SYMPTOMS   Feeling sick to your stomach (nauseous).  Throwing up (vomiting).  Blood in the urine (hematuria).  Pain that usually spreads (radiates) to the groin.  Frequency or urgency of urination. DIAGNOSIS   Taking a history and physical exam.  Blood or urine tests.  Computerized X-ray scan (CT scan).  Occasionally, an examination of the inside of the urinary bladder (cystoscopy) is performed. TREATMENT   Observation.  Increasing your fluid intake.  Surgery may be needed if you have severe pain or persistent obstruction. The size, location, and chemical composition are all important variables that will determine the proper choice of action for you. Talk to your caregiver to better understand your situation so that you will minimize the risk of injury to yourself and your kidney.  HOME CARE INSTRUCTIONS   Drink enough water and fluids to keep your urine clear or pale yellow.  Strain all urine through the provided strainer.  Keep all particulate matter and stones for your caregiver to see. The stone causing the pain may be as small as a grain of salt. It is very important to use the strainer each and every time you pass your urine. The collection of your stone will allow your caregiver to analyze it and verify that a stone has  actually passed.  Only take over-the-counter or prescription medicines for pain, discomfort, or fever as directed by your caregiver.  Make a follow-up appointment with your caregiver as directed.  Get follow-up X-rays if required. The absence of pain does not always mean that the stone has passed. It may have only stopped moving. If the urine remains completely obstructed, it can cause loss of kidney function or even complete destruction of the kidney. It is your responsibility to make sure X-rays and follow-ups are completed. Ultrasounds of the kidney can show blockages and the status of the kidney. Ultrasounds are not associated with any radiation and can be performed easily in a matter of minutes. SEEK IMMEDIATE MEDICAL CARE IF:   Pain cannot be controlled with the prescribed medicine.  You have a fever.  The severity or intensity of pain increases over 18 hours and is not relieved by pain medicine.  You develop a new onset of abdominal pain.  You feel faint or pass out. MAKE SURE YOU:   Understand these instructions.  Will watch your condition.  Will get help right away if you are not doing well or get worse. Document Released: 01/30/2005 Document Revised: 04/24/2011 Document Reviewed: 05/28/2009 [facility] Patient Information 2013 [place], [place].

## 2012-04-24 ENCOUNTER — Encounter (HOSPITAL_COMMUNITY): Payer: Self-pay

## 2012-04-24 ENCOUNTER — Inpatient Hospital Stay (HOSPITAL_COMMUNITY)
Admission: AD | Admit: 2012-04-24 | Discharge: 2012-04-24 | Disposition: A | Discharge status: Home / Self Care | Source: Ambulatory Visit | Attending: Obstetrics and Gynecology | Admitting: Obstetrics and Gynecology

## 2012-04-24 ENCOUNTER — Inpatient Hospital Stay (HOSPITAL_COMMUNITY)

## 2012-04-24 DIAGNOSIS — R109 Unspecified abdominal pain: Secondary | ICD-10-CM | POA: Insufficient documentation

## 2012-04-24 DIAGNOSIS — R52 Pain, unspecified: Secondary | ICD-10-CM

## 2012-04-24 DIAGNOSIS — B9689 Other specified bacterial agents as the cause of diseases classified elsewhere: Secondary | ICD-10-CM | POA: Insufficient documentation

## 2012-04-24 DIAGNOSIS — R319 Hematuria, unspecified: Secondary | ICD-10-CM | POA: Insufficient documentation

## 2012-04-24 DIAGNOSIS — A499 Bacterial infection, unspecified: Secondary | ICD-10-CM | POA: Insufficient documentation

## 2012-04-24 DIAGNOSIS — N760 Acute vaginitis: Secondary | ICD-10-CM | POA: Insufficient documentation

## 2012-04-24 HISTORY — DX: Supervision of pregnancy with other poor reproductive or obstetric history, unspecified trimester: O09.299

## 2012-04-24 LAB — CBC WITH DIFFERENTIAL/PLATELET
Basophils Relative: 0 % (ref 0–1)
Eosinophils Relative: 1 % (ref 0–5)
HCT: 33.6 % — ABNORMAL LOW (ref 36.0–46.0)
Hemoglobin: 11.3 g/dL — ABNORMAL LOW (ref 12.0–15.0)
MCHC: 33.6 g/dL (ref 30.0–36.0)
MCV: 89.4 fL (ref 78.0–100.0)
Monocytes Absolute: 0.9 10*3/uL (ref 0.1–1.0)
Monocytes Relative: 7 % (ref 3–12)
Neutro Abs: 9.9 10*3/uL — ABNORMAL HIGH (ref 1.7–7.7)

## 2012-04-24 LAB — URINALYSIS, ROUTINE W REFLEX MICROSCOPIC
Bilirubin Urine: NEGATIVE
Glucose, UA: NEGATIVE mg/dL
Hgb urine dipstick: NEGATIVE
Ketones, ur: NEGATIVE mg/dL
Leukocytes, UA: NEGATIVE
pH: 7.5 (ref 5.0–8.0)

## 2012-04-24 LAB — WET PREP, GENITAL

## 2012-04-24 MED ORDER — METRONIDAZOLE 500 MG PO TABS: 500.0000 mg | ORAL_TABLET | Freq: Two times a day (BID) | ORAL | Status: DC

## 2012-04-24 MED ORDER — OXYCODONE-ACETAMINOPHEN 5-325 MG PO TABS: 1.0000 | ORAL_TABLET | Freq: Four times a day (QID) | ORAL | Status: DC | PRN

## 2012-04-24 MED ORDER — OXYCODONE-ACETAMINOPHEN 5-325 MG PO TABS
2.0000 | ORAL_TABLET | Freq: Once | ORAL | Status: AC | PRN
  Administered 2012-04-24: 2 via ORAL
  Filled 2012-04-24: qty 2

## 2012-04-24 NOTE — MAU Provider Note (Signed)
Attestation of Attending Supervision of Advanced Practitioner: Evaluation and management procedures were performed by the PA/NP/CNM/OB Fellow under my supervision/collaboration. Chart reviewed and agree with management and plan.  Tilda Burrow 04/24/2012 8:57 PM

## 2012-04-24 NOTE — MAU Note (Signed)
Patient states she had a fever last night (101) and has been taking Tylenol XS every 8 hours since. Having right flank pain, blood in urine and has not felt fetal movement in 2 days.

## 2012-04-24 NOTE — MAU Note (Signed)
Pt states having r flank pain. Taking tylenol with no relief. Decreased fm for past 2 days as well.

## 2012-04-24 NOTE — MAU Provider Note (Signed)
History     CSN: [id number]  Arrival date and time: 04/24/12 1511   First Kelechi Astarita Initiated Contact with Patient 04/24/12 1630      Chief Complaint  Patient presents with  . Hematuria  . Fever  . Flank Pain   HPI Ms. Rosenstock is a 22 y/o female [id number] at [redacted]w[redacted]d w/ history of recurrent UTIs who presents to MAU w/ complaint of flank pain and hematuria. She has been experiencing flank pain, worse on right than left, for the past 3 weeks w/ negative urine cultures x3. She notes that the pain is stabbing, non-radiating, and constant, but varies in intensity. It is currently 7/10, but was initially 9/10 upon MAU arrival. She has difficulty finding a comfortable position. She has taken Tylenol w/o improvement. This morning she noticed some hematuria. She has been experiencing frequent dysuria and increased urinary frequency. No vaginal bleeding or discharge. She did have a fever of >101.0 last night, but no other objective fevers. She does report some light-headedness w/ associated scotomata that occurred prior to pregnancy but has increased in frequency. No abdominal pain, occasional nausea associated w/ worsened pain, but no vomiting.   OB History   Grav Para Term Preterm Abortions TAB SAB Ect Mult Living   4 1 0 1 2  2   1       Past Medical History  Diagnosis Date  . No pertinent past medical history   . Ovarian cyst   . SAB (spontaneous abortion)     x2  . Anemia   . Preterm labor   . H/O oligohydramnios in prior pregnancy, currently pregnant     Past Surgical History  Procedure Laterality Date  . Laparoscopy    . Wisdom tooth extraction      x4  . Dilation and curettage of uterus      x 2  . Cesarean section      Family History  Problem Relation Age of Onset  . Other Neg Hx   . Alcohol abuse Father   . Cancer Maternal Grandmother     cervical  . Hypertension Mother     History  Substance Use Topics  . Smoking status: Never Smoker   . Smokeless tobacco: Never  Used  . Alcohol Use: No    Allergies: No Known Allergies  Prescriptions prior to admission  Medication Sig Dispense Refill  . acetaminophen (TYLENOL) 500 MG tablet Take 500 mg by mouth every 8 (eight) hours as needed for pain.      . Prenatal Vit-Fe Fumarate-FA (PRENATAL MULTIVITAMIN) TABS Take 1 tablet by mouth daily.      . promethazine (PHENERGAN) 25 MG tablet Take 1 tablet (25 mg total) by mouth every 6 (six) hours as needed for nausea.  30 tablet  1    Review of Systems  Constitutional: Positive for fever. Negative for chills.  Eyes: Positive for blurred vision (scotomata w/ light-headedness).  Respiratory: Negative for cough.   Cardiovascular: Negative for chest pain, leg swelling and PND.  Gastrointestinal: Positive for nausea. Negative for vomiting, abdominal pain, diarrhea and constipation.  Genitourinary: Positive for dysuria, urgency, frequency, hematuria and flank pain.  Neurological: Positive for dizziness. Negative for loss of consciousness and headaches.   Physical Exam   Blood pressure 128/78, pulse 101, temperature 98.4 F (36.9 C), temperature source Oral, resp. rate 18, height 5' 2.5" (1.588 m), weight 76.114 kg (167 lb 12.8 oz), last menstrual period 11/15/2011, SpO2 100.00%.  Physical Exam  Vitals reviewed. Constitutional: She  appears well-developed and well-nourished. She appears listless.  Respiratory: Breath sounds normal. No respiratory distress.  GI: Soft. Bowel sounds are normal. She exhibits no distension. There is no tenderness. There is CVA tenderness (left).  Neurological: She appears listless.  Skin: Skin is warm and dry.    MAU Course  Procedures  Pain resolved w/ Percocet. No fever while in MAU. Pt nervous, but well-appearing. No HA. Few episodes of seeing spots upon standing, but none at rest. No epigastric pain.  Cervix long and closed. Small amount of thin, white, malodorous vaginal discharge. No VB. Cervix clean.  EFM: Difficulty to trace  due to gestation, 140's, min-mod variability, no accels, mild variables, but overall reassuring for gestation.  Toco: No UC's. No Hgb on MAU UA.   US Renal  04/24/2012  *RADIOLOGY REPORT*  Clinical Data:  Hematuria, left CVA tenderness, right flank pain, [redacted] weeks pregnant, fever  RENAL/URINARY TRACT ULTRASOUND COMPLETE  Comparison:  None  Findings:  Right Kidney:  11.4 cm length.  Normal cortical thickness and echogenicity.  No mass, hydronephrosis shadowing calcification.  No perinephric fluid.  Left Kidney:  12.1 cm length.  Normal cortical thickness echogenicity.  No mass, hydronephrosis or shadowing calcification. No perinephric fluid.  Bladder:  Normal appearance.  Bilateral ureteral jets visualized.  IMPRESSION: Normal renal ultrasound.   Original Report Authenticated By: Ulyses Southward, M.D.    Results for orders placed during the hospital encounter of 04/24/12 (from the past 24 hour(s))  URINALYSIS, ROUTINE W REFLEX MICROSCOPIC     Status: None   Collection Time    04/24/12  3:15 PM      Result Value Range   Color, Urine YELLOW  YELLOW   APPearance CLEAR  CLEAR   Specific Gravity, Urine 1.015  1.005 - 1.030   pH 7.5  5.0 - 8.0   Glucose, UA NEGATIVE  NEGATIVE mg/dL   Hgb urine dipstick NEGATIVE  NEGATIVE   Bilirubin Urine NEGATIVE  NEGATIVE   Ketones, ur NEGATIVE  NEGATIVE mg/dL   Protein, ur NEGATIVE  NEGATIVE mg/dL   Urobilinogen, UA 0.2  0.0 - 1.0 mg/dL   Nitrite NEGATIVE  NEGATIVE   Leukocytes, UA NEGATIVE  NEGATIVE  CBC WITH DIFFERENTIAL     Status: Abnormal   Collection Time    04/24/12  5:15 PM      Result Value Range   WBC 12.4 (*) 4.0 - 10.5 K/uL   RBC 3.76 (*) 3.87 - 5.11 MIL/uL   Hemoglobin 11.3 (*) 12.0 - 15.0 g/dL   HCT 16.1 (*) 09.6 - 04.5 %   MCV 89.4  78.0 - 100.0 fL   MCH 30.1  26.0 - 34.0 pg   MCHC 33.6  30.0 - 36.0 g/dL   RDW 40.9  81.1 - 91.4 %   Platelets 304  150 - 400 K/uL   Neutrophils Relative 80 (*) 43 - 77 %   Neutro Abs 9.9 (*) 1.7 - 7.7 K/uL    Lymphocytes Relative 12  12 - 46 %   Lymphs Abs 1.5  0.7 - 4.0 K/uL   Monocytes Relative 7  3 - 12 %   Monocytes Absolute 0.9  0.1 - 1.0 K/uL   Eosinophils Relative 1  0 - 5 %   Eosinophils Absolute 0.1  0.0 - 0.7 K/uL   Basophils Relative 0  0 - 1 %   Basophils Absolute 0.0  0.0 - 0.1 K/uL  WET PREP, GENITAL     Status: Abnormal  Collection Time    04/24/12  7:20 PM      Result Value Range   Yeast Wet Prep HPF POC NONE SEEN  NONE SEEN   Trich, Wet Prep NONE SEEN  NONE SEEN   Clue Cells Wet Prep HPF POC MODERATE (*) NONE SEEN   WBC, Wet Prep HPF POC FEW (*) NONE SEEN      Assessment and Plan   1. Acute right flank pain   2. BV (bacterial vaginosis)   Low suspicion for pyelo due to neg UA, afebrile. Possible musculoskeletal or neuro etiology.     D/C home Urine culture, GC/CT pending. Return to MAU if fever > 100.4 when well-hydrated.  Follow-up Information   Follow up with [facility] On 04/25/2012.   Contact information:   [address] [place] [address] [phone]      Follow up with [facility] MATERN[facility] ADMISSIONS. (As needed if symptoms worsen)    Contact information:   [address] [place] [address] [phone]       Medication List    TAKE these medications       acetaminophen 500 MG tablet  Commonly known as:  TYLENOL  Take 500 mg by mouth every 8 (eight) hours as needed for pain.     metroNIDAZOLE 500 MG tablet  Commonly known as:  FLAGYL  Take 1 tablet (500 mg total) by mouth 2 (two) times daily.     oxyCODONE-acetaminophen 5-325 MG per tablet  Commonly known as:  ROXICET  Take 1 tablet by mouth every 6 (six) hours as needed for pain.     prenatal multivitamin Tabs  Take 1 tablet by mouth daily.     promethazine 25 MG tablet  Commonly known as:  PHENERGAN  Take 1 tablet (25 mg total) by mouth every 6 (six) hours as needed for nausea.       Dorathy Kinsman 04/24/2012, 8:10 PM

## 2012-04-24 NOTE — Discharge Instructions (Signed)
Everardo All, Dentist [address] [place], [place] [address] [phone]

## 2012-04-25 ENCOUNTER — Encounter: Admitting: Family Medicine

## 2012-04-25 LAB — GC/CHLAMYDIA PROBE AMP: GC Probe RNA: NEGATIVE

## 2012-04-26 ENCOUNTER — Encounter: Admitting: Obstetrics & Gynecology

## 2012-05-02 ENCOUNTER — Encounter: Admitting: Obstetrics & Gynecology

## 2012-05-02 ENCOUNTER — Telehealth: Payer: Self-pay

## 2012-05-02 ENCOUNTER — Ambulatory Visit (INDEPENDENT_AMBULATORY_CARE_PROVIDER_SITE_OTHER): Admitting: Obstetrics & Gynecology

## 2012-05-02 VITALS — BP 93/62 | Wt 169.0 lb

## 2012-05-02 DIAGNOSIS — Z3480 Encounter for supervision of other normal pregnancy, unspecified trimester: Secondary | ICD-10-CM

## 2012-05-02 DIAGNOSIS — Z3482 Encounter for supervision of other normal pregnancy, second trimester: Secondary | ICD-10-CM

## 2012-05-02 DIAGNOSIS — M549 Dorsalgia, unspecified: Secondary | ICD-10-CM

## 2012-05-02 DIAGNOSIS — O34219 Maternal care for unspecified type scar from previous cesarean delivery: Secondary | ICD-10-CM

## 2012-05-02 DIAGNOSIS — O26899 Other specified pregnancy related conditions, unspecified trimester: Secondary | ICD-10-CM

## 2012-05-02 DIAGNOSIS — O4702 False labor before 37 completed weeks of gestation, second trimester: Secondary | ICD-10-CM

## 2012-05-02 DIAGNOSIS — O479 False labor, unspecified: Secondary | ICD-10-CM

## 2012-05-02 MED ORDER — CYCLOBENZAPRINE HCL 10 MG PO TABS: 10.0000 mg | ORAL_TABLET | Freq: Three times a day (TID) | ORAL | Status: DC | PRN

## 2012-05-02 NOTE — Telephone Encounter (Signed)
Hi Dr. Macon Large, this patient came in to see you today for an OBF. She called back saying you were going to send a RX to her pharmacy for a muscle relaxer but I don't see one sent in Epic, can you please send one to her pharmacy, thanks!

## 2012-05-02 NOTE — Progress Notes (Addendum)
Reports having preterm contractions that are frequent especially in the evenings.  Denies VB, LOF.  Good FM. On exam, closed cervix, FFN sample collected; only about 2 cm of cervix palpated in vagina.  Will obtain ultrasound to check cervical length.  Will follow up results and manage accordingly.  Reports low back painthat is musculoskeletal; Flexeril prescribed.  No other complaints or concerns.  Discussed TOLAC vs RCS, consent given to her to review at home and she will sign one next visit.  Fetal movement and labor precautions reviewed.  Third trimester labs, Tdap vaccine next visit.

## 2012-05-02 NOTE — Addendum Note (Signed)
Addended by: Jaynie Collins A on: 05/02/2012 11:05 PM   Modules accepted: Orders

## 2012-05-02 NOTE — Patient Instructions (Signed)
Return to clinic for any obstetric concerns or go to MAU for evaluation Vaginal Birth After Cesarean Delivery Vaginal birth after Cesarean delivery (VBAC) is giving birth vaginally after previously delivering a baby by a cesarean. In the past, if a woman had a Cesarean delivery, all births afterwards would be done by Cesarean delivery. This is no longer true. It can be safe for the mother to try a vaginal delivery after having a Cesarean. The final decision to have a VBAC or repeat Cesarean delivery should be between the patient and her caregiver. The risks and benefits can be discussed relative to the reason for, and the type of the previous Cesarean delivery. WOMEN WHO PLAN TO HAVE A VBAC SHOULD CHECK WITH THEIR DOCTOR TO BE SURE THAT:  The previous Cesarean was done with a low transverse uterine incision (not a vertical classical incision).  The birth canal is big enough for the baby.  There were no other operations on the uterus.  They will have an electronic fetal monitor (EFM) on at all times during labor.  An operating room would be available and ready in case an emergency Cesarean is needed.  A doctor and surgical nursing staff would be available at all times during labor to be ready to do an emergency Cesarean if necessary.  An anesthesiologist would be present in case an emergency Cesarean is needed.  The nursery is prepared and has adequate personnel and necessary equipment available to care for the baby in case of an emergency Cesarean. BENEFITS OF VBAC:  Shorter stay in the hospital.  Lower delivery, nursery and hospital costs.  Less blood loss and need for blood transfusions.  Less fever and discomfort from major surgery.  Lower risk of blood clots.  Lower risk of infection.  Shorter recovery after going home.  Lower risk of other surgical complications, such as opening of the incision or hernia in the incision.  Decreased risk of injury to other  organs.  Decreased risk for having to remove the uterus (hysterectomy).  Decreased risk for the placenta to completely or partially cover the opening of the uterus (placenta previa) with a future pregnancy.  Ability to have a larger family if desired. RISKS OF A VBAC:  Rupture of the uterus.  Having to remove the uterus (hysterectomy) if it ruptures.  All the complications of major surgery and/or injury to other organs.  Excessive bleeding, blood clots and infection.  Lower Apgar scores (method to evaluate the newborn based on appearance, pulse, grimace, activity, and respiration) and more risks to the baby.  There is a higher risk of uterine rupture if you induce or augment labor.  There is a higher risk of uterine rupture if you use medications to ripen the cervix. VBAC SHOULD NOT BE DONE IF:  The previous Cesarean was done with a vertical (classical) or T-shaped incision, or you do not know what kind of an incision was made.  You had a ruptured uterus.  You had surgery on your uterus.  You have medical or obstetrical problems.  There are problems with the baby.  There were two previous Cesarean deliveries and no vaginal deliveries. OTHER FACTS TO KNOW ABOUT VBAC:  It is safe to have an epidural anesthetic with VBAC.  It is safe to turn the baby from a breech position (attempt an external cephalic version).  It is safe to try a VBAC with twins.  Pregnancies later than 40 weeks have not been successful with VBAC.  There is an  increased failure rate of a VBAC in obese pregnant women.  There is an increased failure rate with VABC if the baby weighs 8.8 pounds (4000 grams) or more.  There is an increased failure rate if the time between the Cesarean and VBAC is less than 19 months.  There is an increased failure rate if pre-eclampsia is present (high blood pressure, protein in the urine and swelling of face and extremities).  VBAC is very successful if there was a  previous vaginal birth.  VBAC is very successful when the labor starts spontaneously before the due date.  Delivery of VBAC is similar to having a normal spontaneous vaginal delivery. It is important to discuss VBAC with your caregiver early in the pregnancy so you can understand the risks, benefits and options. It will give you time to decide what is best in your particular case relevant to the reason for your previous Cesarean delivery. It should be understood that medical changes in the mother or pregnancy may occur during the pregnancy, which make it necessary to change you or your caregiver's initial decision. The counseling, concerns and decisions should be documented in the medical record and signed by all parties. Document Released: 07/23/2006 Document Revised: 04/24/2011 Document Reviewed: 03/13/2008 [facility] Patient Information 2013 [place], [place].

## 2012-05-02 NOTE — Progress Notes (Signed)
P - 80 - Pt states she is having urination problems

## 2012-05-03 ENCOUNTER — Other Ambulatory Visit: Payer: Self-pay | Admitting: Obstetrics & Gynecology

## 2012-05-03 ENCOUNTER — Encounter (HOSPITAL_COMMUNITY): Payer: Self-pay

## 2012-05-03 ENCOUNTER — Ambulatory Visit (HOSPITAL_COMMUNITY)
Admission: RE | Admit: 2012-05-03 | Discharge: 2012-05-03 | Disposition: A | Discharge status: Home / Self Care | Source: Ambulatory Visit | Attending: Obstetrics & Gynecology | Admitting: Obstetrics & Gynecology

## 2012-05-03 DIAGNOSIS — Z3483 Encounter for supervision of other normal pregnancy, third trimester: Secondary | ICD-10-CM

## 2012-05-03 DIAGNOSIS — Z3689 Encounter for other specified antenatal screening: Secondary | ICD-10-CM | POA: Insufficient documentation

## 2012-05-03 DIAGNOSIS — O26879 Cervical shortening, unspecified trimester: Secondary | ICD-10-CM | POA: Insufficient documentation

## 2012-05-03 DIAGNOSIS — O34219 Maternal care for unspecified type scar from previous cesarean delivery: Secondary | ICD-10-CM

## 2012-05-03 DIAGNOSIS — O4702 False labor before 37 completed weeks of gestation, second trimester: Secondary | ICD-10-CM

## 2012-05-03 LAB — FETAL FIBRONECTIN: Fetal Fibronectin: NEGATIVE

## 2012-05-03 NOTE — Progress Notes (Signed)
Quick Note:  Negative FFN. Patient called and informed of results. Pelvic ultrasound also showed cervical length of 4.04 cm , no funneling. Patient reassured by these results. She will continue routine prenatal care. ______

## 2012-05-24 ENCOUNTER — Encounter (HOSPITAL_COMMUNITY): Payer: Self-pay | Admitting: *Deleted

## 2012-05-24 ENCOUNTER — Inpatient Hospital Stay (HOSPITAL_COMMUNITY)
Admission: AD | Admit: 2012-05-24 | Discharge: 2012-05-25 | Disposition: A | Discharge status: Home / Self Care | Source: Ambulatory Visit | Attending: Obstetrics and Gynecology | Admitting: Obstetrics and Gynecology

## 2012-05-24 DIAGNOSIS — M549 Dorsalgia, unspecified: Secondary | ICD-10-CM | POA: Insufficient documentation

## 2012-05-24 DIAGNOSIS — R109 Unspecified abdominal pain: Secondary | ICD-10-CM | POA: Insufficient documentation

## 2012-05-24 DIAGNOSIS — O99891 Other specified diseases and conditions complicating pregnancy: Secondary | ICD-10-CM | POA: Insufficient documentation

## 2012-05-24 LAB — URINALYSIS, ROUTINE W REFLEX MICROSCOPIC
Bilirubin Urine: NEGATIVE
Ketones, ur: NEGATIVE mg/dL
Nitrite: NEGATIVE
Specific Gravity, Urine: 1.01 (ref 1.005–1.030)
Urobilinogen, UA: 0.2 mg/dL (ref 0.0–1.0)

## 2012-05-24 NOTE — MAU Note (Signed)
I've had period cramps for last 4 days and cramping worse today. Nauseated but not vomiting. No bleeding or d/c but staying wet with ? fld

## 2012-05-25 DIAGNOSIS — O99891 Other specified diseases and conditions complicating pregnancy: Secondary | ICD-10-CM

## 2012-05-25 DIAGNOSIS — M549 Dorsalgia, unspecified: Secondary | ICD-10-CM

## 2012-05-25 DIAGNOSIS — R109 Unspecified abdominal pain: Secondary | ICD-10-CM

## 2012-05-25 MED ORDER — ACETAMINOPHEN 325 MG PO TABS
650.0000 mg | ORAL_TABLET | Freq: Once | ORAL | Status: AC
  Administered 2012-05-25: 650 mg via ORAL
  Filled 2012-05-25: qty 2

## 2012-05-25 NOTE — MAU Provider Note (Signed)
History     CSN: [id number]  Arrival date and time: 05/24/12 2248   None     Chief Complaint  Patient presents with  . Abdominal Cramping  . Back Pain   HPI Ms Schwandt is a 22yo [id number] at 27.3wks who presents for eval of low/mid abd cramping. Feels like mild menses cramps. Denies bldg or dysuria. No fever or N/V/D. She is a pt of [facility].  OB History   Grav Para Term Preterm Abortions TAB SAB Ect Mult Living   4 1 0 1 2  2   1       Past Medical History  Diagnosis Date  . No pertinent past medical history   . Ovarian cyst   . SAB (spontaneous abortion)     x2  . Anemia   . Preterm labor   . H/O oligohydramnios in prior pregnancy, currently pregnant     Past Surgical History  Procedure Laterality Date  . Laparoscopy    . Wisdom tooth extraction      x4  . Dilation and curettage of uterus      x 2  . Cesarean section      Family History  Problem Relation Age of Onset  . Other Neg Hx   . Alcohol abuse Father   . Cancer Maternal Grandmother     cervical  . Hypertension Mother     History  Substance Use Topics  . Smoking status: Never Smoker   . Smokeless tobacco: Never Used  . Alcohol Use: No    Allergies: No Known Allergies  Prescriptions prior to admission  Medication Sig Dispense Refill  . acetaminophen (TYLENOL) 500 MG tablet Take 500 mg by mouth every 8 (eight) hours as needed for pain.      . cyclobenzaprine (FLEXERIL) 10 MG tablet Take 1 tablet (10 mg total) by mouth 3 (three) times daily as needed for muscle spasms.  30 tablet  2  . Prenatal Vit-Fe Fumarate-FA (PRENATAL MULTIVITAMIN) TABS Take 1 tablet by mouth daily.      . promethazine (PHENERGAN) 25 MG tablet Take 1 tablet (25 mg total) by mouth every 6 (six) hours as needed for nausea.  30 tablet  1  . [DISCONTINUED] metroNIDAZOLE (FLAGYL) 500 MG tablet Take 1 tablet (500 mg total) by mouth 2 (two) times daily.  14 tablet  0    ROS Physical Exam   Blood pressure 106/63, pulse  94, temperature 98 F (36.7 C), resp. rate 18, height 5\' 5"  (1.651 m), weight 173 lb 9.6 oz (78.744 kg), last menstrual period 11/15/2011, SpO2 100.00%.  Physical Exam  Constitutional: She is oriented to person, place, and time. She appears well-developed.  HENT:  Head: Normocephalic.  Cardiovascular: Normal rate.   Respiratory: Effort normal.  GI:  FHR 130s + accels, no decels No ctx per toco  Genitourinary: Vagina normal.  Cx closed/thick  Musculoskeletal: Normal range of motion.  Neurological: She is alert and oriented to person, place, and time.  Skin: Skin is warm and dry.  Psychiatric: She has a normal mood and affect. Her behavior is normal. Thought content normal.   Urinalysis    Component Value Date/Time   COLORURINE YELLOW 05/24/2012 2310   APPEARANCEUR CLEAR 05/24/2012 2310   LABSPEC 1.010 05/24/2012 2310   PHURINE 7.0 05/24/2012 2310   GLUCOSEU NEGATIVE 05/24/2012 2310   HGBUR TRACE* 05/24/2012 2310   BILIRUBINUR NEGATIVE 05/24/2012 2310   BILIRUBINUR neg 04/09/2012 0948   KETONESUR NEGATIVE 05/24/2012 2310  PROTEINUR NEGATIVE 05/24/2012 2310   UROBILINOGEN 0.2 05/24/2012 2310   UROBILINOGEN 0.2 04/09/2012 0948   NITRITE NEGATIVE 05/24/2012 2310   NITRITE neg 04/09/2012 0948   LEUKOCYTESUR SMALL* 05/24/2012 2310     MAU Course  Procedures    Assessment and Plan  IUP at 27.3wks Round lig pain  Given Tylenol 650mg  prior to d/c D/C home with preterm labor precautions F/U as scheduled at next [facility] appointment  SHAW, [facility] 05/25/2012, 12:49 AM

## 2012-05-26 NOTE — MAU Provider Note (Signed)
Attestation of Attending Supervision of Advanced Practitioner: Evaluation and management procedures were performed by the PA/NP/CNM/OB Fellow under my supervision/collaboration. Chart reviewed and agree with management and plan.  Kyree Adriano V 05/26/2012 2:49 PM

## 2012-05-28 ENCOUNTER — Encounter: Payer: Self-pay | Admitting: Obstetrics and Gynecology

## 2012-05-28 ENCOUNTER — Ambulatory Visit (INDEPENDENT_AMBULATORY_CARE_PROVIDER_SITE_OTHER): Admitting: Obstetrics and Gynecology

## 2012-05-28 VITALS — BP 116/79 | Wt 176.6 lb

## 2012-05-28 DIAGNOSIS — Z3480 Encounter for supervision of other normal pregnancy, unspecified trimester: Secondary | ICD-10-CM

## 2012-05-28 DIAGNOSIS — Z23 Encounter for immunization: Secondary | ICD-10-CM

## 2012-05-28 DIAGNOSIS — Z3482 Encounter for supervision of other normal pregnancy, second trimester: Secondary | ICD-10-CM

## 2012-05-28 DIAGNOSIS — O34219 Maternal care for unspecified type scar from previous cesarean delivery: Secondary | ICD-10-CM

## 2012-05-28 NOTE — Progress Notes (Signed)
Has noticed pain in both wrists and also a pain that shoots down her right leg.  Has noticed "menstrual like" cramps.  Doing 1hr GTT today.

## 2012-05-28 NOTE — Progress Notes (Signed)
Patient doing well without complaints. Reports occasional menstrual cramp like pains for which she had a normal evaluation for in MAU. FM/PTL precautions reviewed. Patient signed consent for TOLAC. F/u 1 hr GCT today

## 2012-05-29 NOTE — Addendum Note (Signed)
Addended by: Barbara Cower on: 05/29/2012 09:25 AM   Modules accepted: Orders

## 2012-05-29 NOTE — Addendum Note (Signed)
Addended by: Barbara Cower on: 05/29/2012 09:26 AM   Modules accepted: Orders

## 2012-05-30 ENCOUNTER — Encounter: Payer: Self-pay | Admitting: Obstetrics and Gynecology

## 2012-05-30 LAB — GLUCOSE TOLERANCE, 1 HOUR: Glucose, 1Hr PP: 107 mg/dL (ref 65–199)

## 2012-05-30 LAB — RPR: RPR: NONREACTIVE

## 2012-05-30 LAB — CBC WITH DIFFERENTIAL/PLATELET
Basophils Absolute: 0 10*3/uL (ref 0.0–0.2)
Eos: 1 % (ref 0–5)
Immature Grans (Abs): 0 10*3/uL (ref 0.0–0.1)
Immature Granulocytes: 0 % (ref 0–2)
MCHC: 32.4 g/dL (ref 31.5–35.7)
Monocytes: 6 % (ref 4–12)
Neutrophils Relative %: 82 % — ABNORMAL HIGH (ref 40–74)
RDW: 13.1 % (ref 12.3–15.4)
WBC: 12.5 10*3/uL — ABNORMAL HIGH (ref 3.4–10.8)

## 2012-05-30 LAB — HIV ANTIBODY (ROUTINE TESTING W REFLEX): HIV-1/HIV-2 Ab: NONREACTIVE

## 2012-06-05 ENCOUNTER — Ambulatory Visit (INDEPENDENT_AMBULATORY_CARE_PROVIDER_SITE_OTHER): Admitting: Family Medicine

## 2012-06-05 VITALS — BP 108/79 | Wt 175.0 lb

## 2012-06-05 DIAGNOSIS — Z139 Encounter for screening, unspecified: Secondary | ICD-10-CM

## 2012-06-05 DIAGNOSIS — O34219 Maternal care for unspecified type scar from previous cesarean delivery: Secondary | ICD-10-CM

## 2012-06-05 DIAGNOSIS — Z3480 Encounter for supervision of other normal pregnancy, unspecified trimester: Secondary | ICD-10-CM

## 2012-06-05 DIAGNOSIS — R002 Palpitations: Secondary | ICD-10-CM | POA: Insufficient documentation

## 2012-06-05 LAB — POCT URINALYSIS DIPSTICK
Blood, UA: NEGATIVE
Glucose, UA: NEGATIVE
Ketones, UA: NEGATIVE
Protein, UA: NEGATIVE
Spec Grav, UA: 1.01
Urobilinogen, UA: 0.2

## 2012-06-05 NOTE — Progress Notes (Signed)
Cramping and LBP--with contractions--Cervix is thin but not open. PTL precautions Complains of palpitations.  HR is 118 at rest.  Will check TSH, consider cardiology referral.

## 2012-06-05 NOTE — Patient Instructions (Addendum)
Preterm Labor Preterm labor is when labor starts at less than 37 weeks of pregnancy. The normal length of a pregnancy is 39 to 41 weeks. CAUSES Often, there is no identifiable underlying cause as to why a woman goes into preterm labor. However, one of the most common known causes of preterm labor is infection. Infections of the uterus, cervix, vagina, amniotic sac, bladder, kidney, or even the lungs (pneumonia) can cause labor to start. Other causes of preterm labor include:  Urogenital infections, such as yeast infections and bacterial vaginosis.  Uterine abnormalities (uterine shape, uterine septum, fibroids, bleeding from the placenta).  A cervix that has been operated on and opens prematurely.  Malformations in the baby.  Multiple gestations (twins, triplets, and so on).  Breakage of the amniotic sac. Additional risk factors for preterm labor include:  Previous history of preterm labor.  Premature rupture of membranes (PROM).  A placenta that covers the opening of the cervix (placenta previa).  A placenta that separates from the uterus (placenta abruption).  A cervix that is too weak to hold the baby in the uterus (incompetence cervix).  Having too much fluid in the amniotic sac (polyhydramnios).  Taking illegal drugs or smoking while pregnant.  Not gaining enough weight while pregnant.  Women younger than 11 and older than 24 years old.  Low socioeconomic status.  African-American ethnicity. SYMPTOMS Signs and symptoms of preterm labor include:  Menstrual-like cramps.  Contractions that are 30 to 70 seconds apart, become very regular, closer together, and are more intense and painful.  Contractions that start on the top of the uterus and spread down to the lower abdomen and back.  A sense of increased pelvic pressure or back pain.  A watery or bloody discharge that comes from the vagina. DIAGNOSIS  A diagnosis can be confirmed by:  A vaginal exam.  An  ultrasound of the cervix.  Sampling (swabbing) cervico-vaginal secretions. These samples can be tested for the presence of fetal fibronectin. This is a protein found in cervical discharge which is associated with preterm labor.  Fetal monitoring. TREATMENT  Depending on the length of the pregnancy and other circumstances, a caregiver may suggest bed rest. If necessary, there are medicines that can be given to stop contractions and to quicken fetal lung maturity. If labor happens before 34 weeks of pregnancy, a prolonged hospital stay may be recommended. Treatment depends on the condition of both the mother and baby. PREVENTION There are some things a mother can do to lower the risk of preterm labor in future pregnancies. A woman can:   Stop smoking.  Maintain healthy weight gain and avoid chemicals and drugs that are not necessary.  Be watchful for any type of infection.  Inform her caregiver if she has a known history of preterm labor. Document Released: 04/22/2003 Document Revised: 04/24/2011 Document Reviewed: 05/27/2010 [facility] Patient Information 2013 [place], [place].  Pregnancy - Third Trimester The third trimester of pregnancy (the last 3 months) is a period of the most rapid growth for you and your baby. The baby approaches a length of 20 inches and a weight of 6 to 10 pounds. The baby is adding on fat and getting ready for life outside your body. While inside, babies have periods of sleeping and waking, suck their thumbs, and hiccups. You can often feel small contractions of the uterus. This is false labor. It is also called Braxton-Hicks contractions. This is like a practice for labor. The usual problems in this stage of pregnancy  include more difficulty breathing, swelling of the hands and feet from water retention, and having to urinate more often because of the uterus and baby pressing on your bladder.  PRENATAL EXAMS  Blood work may continue to be done during prenatal exams.  These tests are done to check on your health and the probable health of your baby. Blood work is used to follow your blood levels (hemoglobin). Anemia (low hemoglobin) is common during pregnancy. Iron and vitamins are given to help prevent this. You may also continue to be checked for diabetes. Some of the past blood tests may be done again.  The size of the uterus is measured during each visit. This makes sure your baby is growing properly according to your pregnancy dates.  Your blood pressure is checked every prenatal visit. This is to make sure you are not getting toxemia.  Your urine is checked every prenatal visit for infection, diabetes and protein.  Your weight is checked at each visit. This is done to make sure gains are happening at the suggested rate and that you and your baby are growing normally.  Sometimes, an ultrasound is performed to confirm the position and the proper growth and development of the baby. This is a test done that bounces harmless sound waves off the baby so your caregiver can more accurately determine due dates.  Discuss the type of pain medication and anesthesia you will have during your labor and delivery.  Discuss the possibility and anesthesia if a Cesarean Section might be necessary.  Inform your caregiver if there is any mental or physical violence at home. Sometimes, a specialized non-stress test, contraction stress test and biophysical profile are done to make sure the baby is not having a problem. Checking the amniotic fluid surrounding the baby is called an amniocentesis. The amniotic fluid is removed by sticking a needle into the belly (abdomen). This is sometimes done near the end of pregnancy if an early delivery is required. In this case, it is done to help make sure the baby's lungs are mature enough for the baby to live outside of the womb. If the lungs are not mature and it is unsafe to deliver the baby, an injection of cortisone medication is given  to the mother 1 to 2 days before the delivery. This helps the baby's lungs mature and makes it safer to deliver the baby. CHANGES OCCURING IN THE THIRD TRIMESTER OF PREGNANCY Your body goes through many changes during pregnancy. They vary from person to person. Talk to your caregiver about changes you notice and are concerned about.  During the last trimester, you have probably had an increase in your appetite. It is normal to have cravings for certain foods. This varies from person to person and pregnancy to pregnancy.  You may begin to get stretch marks on your hips, abdomen, and breasts. These are normal changes in the body during pregnancy. There are no exercises or medications to take which prevent this change.  Constipation may be treated with a stool softener or adding bulk to your diet. Drinking lots of fluids, fiber in vegetables, fruits, and whole grains are helpful.  Exercising is also helpful. If you have been very active up until your pregnancy, most of these activities can be continued during your pregnancy. If you have been less active, it is helpful to start an exercise program such as walking. Consult your caregiver before starting exercise programs.  Avoid all smoking, alcohol, un-prescribed drugs, herbs and "street drugs" during  your pregnancy. These chemicals affect the formation and growth of the baby. Avoid chemicals throughout the pregnancy to ensure the delivery of a healthy infant.  Backache, varicose veins and hemorrhoids may develop or get worse.  You will tire more easily in the third trimester, which is normal.  The baby's movements may be stronger and more often.  You may become short of breath easily.  Your belly button may stick out.  A yellow discharge may leak from your breasts called colostrum.  You may have a bloody mucus discharge. This usually occurs a few days to a week before labor begins. HOME CARE INSTRUCTIONS   Keep your caregiver's  appointments. Follow your caregiver's instructions regarding medication use, exercise, and diet.  During pregnancy, you are providing food for you and your baby. Continue to eat regular, well-balanced meals. Choose foods such as meat, fish, milk and other low fat dairy products, vegetables, fruits, and whole-grain breads and cereals. Your caregiver will tell you of the ideal weight gain.  A physical sexual relationship may be continued throughout pregnancy if there are no other problems such as early (premature) leaking of amniotic fluid from the membranes, vaginal bleeding, or belly (abdominal) pain.  Exercise regularly if there are no restrictions. Check with your caregiver if you are unsure of the safety of your exercises. Greater weight gain will occur in the last 2 trimesters of pregnancy. Exercising helps:  Control your weight.  Get you in shape for labor and delivery.  You lose weight after you deliver.  Rest a lot with legs elevated, or as needed for leg cramps or low back pain.  Wear a good support or jogging bra for breast tenderness during pregnancy. This may help if worn during sleep. Pads or tissues may be used in the bra if you are leaking colostrum.  Do not use hot tubs, steam rooms, or saunas.  Wear your seat belt when driving. This protects you and your baby if you are in an accident.  Avoid raw meat, cat litter boxes and soil used by cats. These carry germs that can cause birth defects in the baby.  It is easier to loose urine during pregnancy. Tightening up and strengthening the pelvic muscles will help with this problem. You can practice stopping your urination while you are going to the bathroom. These are the same muscles you need to strengthen. It is also the muscles you would use if you were trying to stop from passing gas. You can practice tightening these muscles up 10 times a set and repeating this about 3 times per day. Once you know what muscles to tighten up, do  not perform these exercises during urination. It is more likely to cause an infection by backing up the urine.  Ask for help if you have financial, counseling or nutritional needs during pregnancy. Your caregiver will be able to offer counseling for these needs as well as refer you for other special needs.  Make a list of emergency phone numbers and have them available.  Plan on getting help from family or friends when you go home from the hospital.  Make a trial run to the hospital.  Take prenatal classes with the father to understand, practice and ask questions about the labor and delivery.  Prepare the baby's room/nursery.  Do not travel out of the city unless it is absolutely necessary and with the advice of your caregiver.  Wear only low or no heal shoes to have better balance and prevent  falling. MEDICATIONS AND DRUG USE IN PREGNANCY  Take prenatal vitamins as directed. The vitamin should contain 1 milligram of folic acid. Keep all vitamins out of reach of children. Only a couple vitamins or tablets containing iron may be fatal to a baby or young child when ingested.  Avoid use of all medications, including herbs, over-the-counter medications, not prescribed or suggested by your caregiver. Only take over-the-counter or prescription medicines for pain, discomfort, or fever as directed by your caregiver. Do not use aspirin, ibuprofen (Motrin, Advil, Nuprin) or naproxen (Aleve) unless OK'd by your caregiver.  Let your caregiver also know about herbs you may be using.  Alcohol is related to a number of birth defects. This includes fetal alcohol syndrome. All alcohol, in any form, should be avoided completely. Smoking will cause low birth rate and premature babies.  Street/illegal drugs are very harmful to the baby. They are absolutely forbidden. A baby born to an addicted mother will be addicted at birth. The baby will go through the same withdrawal an adult does. SEEK MEDICAL CARE  IF: You have any concerns or worries during your pregnancy. It is better to call with your questions if you feel they cannot wait, rather than worry about them. DECISIONS ABOUT CIRCUMCISION You may or may not know the sex of your baby. If you know your baby is a boy, it may be time to think about circumcision. Circumcision is the removal of the foreskin of the penis. This is the skin that covers the sensitive end of the penis. There is no proven medical need for this. Often this decision is made on what is popular at the time or based upon religious beliefs and social issues. You can discuss these issues with your caregiver or pediatrician. SEEK IMMEDIATE MEDICAL CARE IF:   An unexplained oral temperature above 102 F (38.9 C) develops, or as your caregiver suggests.  You have leaking of fluid from the vagina (birth canal). If leaking membranes are suspected, take your temperature and tell your caregiver of this when you call.  There is vaginal spotting, bleeding or passing clots. Tell your caregiver of the amount and how many pads are used.  You develop a bad smelling vaginal discharge with a change in the color from clear to white.  You develop vomiting that lasts more than 24 hours.  You develop chills or fever.  You develop shortness of breath.  You develop burning on urination.  You loose more than 2 pounds of weight or gain more than 2 pounds of weight or as suggested by your caregiver.  You notice sudden swelling of your face, hands, and feet or legs.  You develop belly (abdominal) pain. Round ligament discomfort is a common non-cancerous (benign) cause of abdominal pain in pregnancy. Your caregiver still must evaluate you.  You develop a severe headache that does not go away.  You develop visual problems, blurred or double vision.  If you have not felt your baby move for more than 1 hour. If you think the baby is not moving as much as usual, eat something with sugar in it and  lie down on your left side for an hour. The baby should move at least 4 to 5 times per hour. Call right away if your baby moves less than that.  You fall, are in a car accident or any kind of trauma.  There is mental or physical violence at home. Document Released: 01/24/2001 Document Revised: 04/24/2011 Document Reviewed: 07/29/2008 [employer] Patient Information  [address] [place].  Breastfeeding Deciding to breastfeed is one of the best choices you can make for you and your baby. The information that follows gives a brief overview of the benefits of breastfeeding as well as common topics surrounding breastfeeding. BENEFITS OF BREASTFEEDING For the baby  The first milk (colostrum) helps the baby's digestive system function better.   There are antibodies in the mother's milk that help the baby fight off infections.   The baby has a lower incidence of asthma, allergies, and sudden infant death syndrome (SIDS).   The nutrients in breast milk are better for the baby than infant formulas, and breast milk helps the baby's brain grow better.   Babies who breastfeed have less gas, colic, and constipation.  For the mother  Breastfeeding helps develop a very special bond between the mother and her baby.   Breastfeeding is convenient, always available at the correct temperature, and costs nothing.   Breastfeeding burns calories in the mother and helps her lose weight that was gained during pregnancy.   Breastfeeding makes the uterus contract back down to normal size faster and slows bleeding following delivery.   Breastfeeding mothers have a lower risk of developing breast cancer.  BREASTFEEDING FREQUENCY  A healthy, full-term baby may breastfeed as often as every hour or space his or her feedings to every 3 hours.   Watch your baby for signs of hunger. Nurse your baby if he or she shows signs of hunger. How often you nurse will vary from baby to baby.   Nurse as often as  the baby requests, or when you feel the need to reduce the fullness of your breasts.   Awaken the baby if it has been 3 4 hours since the last feeding.   Frequent feeding will help the mother make more milk and will help prevent problems, such as sore nipples and engorgement of the breasts.  BABY'S POSITION AT THE BREAST  Whether lying down or sitting, be sure that the baby's tummy is facing your tummy.   Support the breast with 4 fingers underneath the breast and the thumb above. Make sure your fingers are well away from the nipple and baby's mouth.   Stroke the baby's lips gently with your finger or nipple.   When the baby's mouth is open wide enough, place all of your nipple and as much of the areola as possible into your baby's mouth.   Pull the baby in close so the tip of the nose and the baby's cheeks touch the breast during the feeding.  FEEDINGS AND SUCTION  The length of each feeding varies from baby to baby and from feeding to feeding.   The baby must suck about 2 3 minutes for your milk to get to him or her. This is called a "let down." For this reason, allow the baby to feed on each breast as long as he or she wants. Your baby will end the feeding when he or she has received the right balance of nutrients.   To break the suction, put your finger into the corner of the baby's mouth and slide it between his or her gums before removing your breast from his or her mouth. This will help prevent sore nipples.  HOW TO TELL WHETHER YOUR BABY IS GETTING ENOUGH BREAST MILK. Wondering whether or not your baby is getting enough milk is a common concern among mothers. You can be assured that your baby is getting enough milk if:  Your baby is actively sucking and you hear swallowing.   Your baby seems relaxed and satisfied after a feeding.   Your baby nurses at least 8 12 times in a 24 hour time period. Nurse your baby until he or she unlatches or falls asleep at the first  breast (at least 10 20 minutes), then offer the second side.   Your baby is wetting 5 6 disposable diapers (6 8 cloth diapers) in a 24 hour period by 67 11 days of age.   Your baby is having at least 3 4 stools every 24 hours for the first 6 weeks. The stool should be soft and yellow.   Your baby should gain 4 7 ounces per week after he or she is 2 days old.   Your breasts feel softer after nursing.  REDUCING BREAST ENGORGEMENT  In the first week after your baby is born, you may experience signs of breast engorgement. When breasts are engorged, they feel heavy, warm, full, and may be tender to the touch. You can reduce engorgement if you:   Nurse frequently, every 2 3 hours. Mothers who breastfeed early and often have fewer problems with engorgement.   Place light ice packs on your breasts for 10 20 minutes between feedings. This reduces swelling. Wrap the ice packs in a lightweight towel to protect your skin. Bags of frozen vegetables work well for this purpose.   Take a warm shower or apply warm, moist heat to your breast for 5 10 minutes just before each feeding. This increases circulation and helps the milk flow.   Gently massage your breast before and during the feeding. Using your finger tips, massage from the chest wall towards your nipple in a circular motion.   Make sure that the baby empties at least one breast at every feeding before switching sides.   Use a breast pump to empty the breasts if your baby is sleepy or not nursing well. You may also want to pump if you are returning to work oryou feel you are getting engorged.   Avoid bottle feeds, pacifiers, or supplemental feedings of water or juice in place of breastfeeding. Breast milk is all the food your baby needs. It is not necessary for your baby to have water or formula. In fact, to help your breasts make more milk, it is best not to give your baby supplemental feedings during the early weeks.   Be sure the  baby is latched on and positioned properly while breastfeeding.   Wear a supportive bra, avoiding underwire styles.   Eat a balanced diet with enough fluids.   Rest often, relax, and take your prenatal vitamins to prevent fatigue, stress, and anemia.  If you follow these suggestions, your engorgement should improve in 24 48 hours. If you are still experiencing difficulty, call your lactation consultant or caregiver.  CARING FOR YOURSELF Take care of your breasts  Bathe or shower daily.   Avoid using soap on your nipples.   Start feedings on your left breast at one feeding and on your right breast at the next feeding.   You will notice an increase in your milk supply 2 5 days after delivery. You may feel some discomfort from engorgement, which makes your breasts very firm and often tender. Engorgement "peaks" out within 24 48 hours. In the meantime, apply warm moist towels to your breasts for 5 10 minutes before feeding. Gentle massage and expression of some milk before feeding will soften your breasts, making  it easier for your baby to latch on.   Wear a well-fitting nursing bra, and air dry your nipples for a 3 after each feeding.   Only use cotton bra pads.   Only use pure lanolin on your nipples after nursing. You do not need to wash it off before feeding the baby again. Another option is to express a few drops of breast milk and gently massage it into your nipples.  Take care of yourself  Eat well-balanced meals and nutritious snacks.   Drinking milk, fruit juice, and water to satisfy your thirst (about 8 glasses a day).   Get plenty of rest.  Avoid foods that you notice affect the baby in a bad way.  SEEK MEDICAL CARE IF:   You have difficulty with breastfeeding and need help.   You have a hard, red, sore area on your breast that is accompanied by a fever.   Your baby is too sleepy to eat well or is having trouble sleeping.   Your baby is  wetting less than 6 diapers a day, by 20 days of age.   Your baby's skin or white part of his or her eyes is more yellow than it was in the hospital.   You feel depressed.  Document Released: 01/30/2005 Document Revised: 08/01/2011 Document Reviewed: 04/30/2011 [facility] Patient Information 2013 [place], [place].

## 2012-06-05 NOTE — Progress Notes (Signed)
P - 118 - Pt states she has had lower back pain, shortness of breath, heart palpitations, and lower abd cramping - does not feel well at all.

## 2012-06-06 ENCOUNTER — Inpatient Hospital Stay (HOSPITAL_COMMUNITY)
Admission: AD | Admit: 2012-06-06 | Discharge: 2012-06-06 | Disposition: A | Discharge status: Home / Self Care | Source: Ambulatory Visit | Attending: Obstetrics & Gynecology | Admitting: Obstetrics & Gynecology

## 2012-06-06 ENCOUNTER — Inpatient Hospital Stay (HOSPITAL_COMMUNITY)

## 2012-06-06 ENCOUNTER — Encounter (HOSPITAL_COMMUNITY): Payer: Self-pay | Admitting: *Deleted

## 2012-06-06 ENCOUNTER — Telehealth: Payer: Self-pay | Admitting: *Deleted

## 2012-06-06 ENCOUNTER — Encounter: Admitting: Obstetrics & Gynecology

## 2012-06-06 DIAGNOSIS — Z3482 Encounter for supervision of other normal pregnancy, second trimester: Secondary | ICD-10-CM

## 2012-06-06 DIAGNOSIS — R109 Unspecified abdominal pain: Secondary | ICD-10-CM

## 2012-06-06 DIAGNOSIS — N949 Unspecified condition associated with female genital organs and menstrual cycle: Secondary | ICD-10-CM

## 2012-06-06 DIAGNOSIS — O26899 Other specified pregnancy related conditions, unspecified trimester: Secondary | ICD-10-CM

## 2012-06-06 DIAGNOSIS — O99891 Other specified diseases and conditions complicating pregnancy: Secondary | ICD-10-CM

## 2012-06-06 DIAGNOSIS — O34219 Maternal care for unspecified type scar from previous cesarean delivery: Secondary | ICD-10-CM

## 2012-06-06 LAB — URINE MICROSCOPIC-ADD ON

## 2012-06-06 LAB — URINALYSIS, ROUTINE W REFLEX MICROSCOPIC
Glucose, UA: NEGATIVE mg/dL
Hgb urine dipstick: NEGATIVE
Protein, ur: NEGATIVE mg/dL
pH: 7.5 (ref 5.0–8.0)

## 2012-06-06 LAB — WET PREP, GENITAL: Clue Cells Wet Prep HPF POC: NONE SEEN

## 2012-06-06 NOTE — MAU Provider Note (Signed)
History     CSN: [id number]  Arrival date and time: 06/06/12 1159   None     Chief Complaint  Patient presents with  . Abdominal Cramping   HPI  Pt is a [id number] here at 29.1 wks IUP here with report of leaking of fluid that started over the weekend.  No report of gush of fluid.  +wet in underwear.  No soaking of underwear or abnormal odor.  Denies vaginal bleeding.  +fetal movement.  Decreased prior to arrival, but has since picked up since arrival in MAU.    Past Medical History  Diagnosis Date  . No pertinent past medical history   . Ovarian cyst   . SAB (spontaneous abortion)     x2  . Anemia   . Preterm labor   . H/O oligohydramnios in prior pregnancy, currently pregnant     Past Surgical History  Procedure Laterality Date  . Laparoscopy    . Wisdom tooth extraction      x4  . Dilation and curettage of uterus      x 2  . Cesarean section      Family History  Problem Relation Age of Onset  . Other Neg Hx   . Alcohol abuse Father   . Cancer Maternal Grandmother     cervical  . Hypertension Mother     History  Substance Use Topics  . Smoking status: Never Smoker   . Smokeless tobacco: Never Used  . Alcohol Use: No    Allergies: No Known Allergies  Prescriptions prior to admission  Medication Sig Dispense Refill  . acetaminophen (TYLENOL) 500 MG tablet Take 500 mg by mouth every 8 (eight) hours as needed for pain.      Marland Kitchen acetaminophen-codeine (TYLENOL #4) 300-60 MG per tablet Take 1 tablet by mouth every 4 (four) hours as needed for pain.      . Prenatal Vit-Fe Fumarate-FA (PRENATAL MULTIVITAMIN) TABS Take 1 tablet by mouth daily.      . promethazine (PHENERGAN) 25 MG tablet Take 1 tablet (25 mg total) by mouth every 6 (six) hours as needed for nausea.  30 tablet  1    Review of Systems  Gastrointestinal: Positive for abdominal pain (cramping).  Genitourinary:       Vaginal discharge  All other systems reviewed and are negative.   Physical Exam     Blood pressure 123/81, pulse 102, temperature 98.2 F (36.8 C), temperature source Oral, resp. rate 18, height 5\' 2"  (1.575 m), weight 80.74 kg (178 lb), last menstrual period 11/15/2011.  Physical Exam  Constitutional: She is oriented to person, place, and time. She appears well-developed and well-nourished. No distress.  HENT:  Head: Normocephalic.  Neck: Normal range of motion. Neck supple.  Cardiovascular: Normal rate, regular rhythm and normal heart sounds.   Respiratory: Effort normal and breath sounds normal.  GI: Soft. There is no tenderness.  Genitourinary: No bleeding around the vagina. Vaginal discharge (mucusy) found.  Pooling negative  Neurological: She is alert and oriented to person, place, and time.  Skin: Skin is warm and dry.    cervix closed/50  MAU Course  Procedures  Results for orders placed during the hospital encounter of 06/06/12 (from the past 24 hour(s))  URINALYSIS, ROUTINE W REFLEX MICROSCOPIC     Status: Abnormal   Collection Time    06/06/12 12:22 PM      Result Value Range   Color, Urine YELLOW  YELLOW   APPearance HAZY (*) CLEAR  Specific Gravity, Urine 1.015  1.005 - 1.030   pH 7.5  5.0 - 8.0   Glucose, UA NEGATIVE  NEGATIVE mg/dL   Hgb urine dipstick NEGATIVE  NEGATIVE   Bilirubin Urine NEGATIVE  NEGATIVE   Ketones, ur NEGATIVE  NEGATIVE mg/dL   Protein, ur NEGATIVE  NEGATIVE mg/dL   Urobilinogen, UA 0.2  0.0 - 1.0 mg/dL   Nitrite NEGATIVE  NEGATIVE   Leukocytes, UA TRACE (*) NEGATIVE  URINE MICROSCOPIC-ADD ON     Status: Abnormal   Collection Time    06/06/12 12:22 PM      Result Value Range   Squamous Epithelial / LPF FEW (*) RARE   WBC, UA 0-2  <3 WBC/hpf   RBC / HPF 0-2  <3 RBC/hpf   Bacteria, UA FEW (*) RARE   Urine-Other AMORPHOUS URATES/PHOSPHATES    WET PREP, GENITAL     Status: Abnormal   Collection Time    06/06/12  1:30 PM      Result Value Range   Yeast Wet Prep HPF POC NONE SEEN  NONE SEEN   Trich, Wet Prep NONE  SEEN  NONE SEEN   Clue Cells Wet Prep HPF POC NONE SEEN  NONE SEEN   WBC, Wet Prep HPF POC FEW (*) NONE SEEN  FHR 120's, +accels, reactive Toco - x 1   Assessment and Plan  Vaginal Discharge - Normal Exam Category I Fetal Tracing  Plan: DC to home Provide reassurance Preterm Labor Precautions. Keep scheduled appointment.   [facility] 06/06/2012, 1:14 PM

## 2012-06-06 NOTE — Telephone Encounter (Signed)
Pt called adv cramps are worse today than yesterday during her office visit - pt adv when cramps start, the pain gets worse in lower back and the palpitations start. I adv if pain is worse today than yesterday that she needed to report to MAU - Pt will get husband to take her today

## 2012-06-06 NOTE — MAU Note (Signed)
Cramping since the weekend, saw dr yesterday closed but 50% effaced.  Cramping started up again- more frequent.

## 2012-06-07 LAB — GC/CHLAMYDIA PROBE AMP: GC Probe RNA: NEGATIVE

## 2012-06-13 ENCOUNTER — Encounter (HOSPITAL_COMMUNITY): Payer: Self-pay | Admitting: *Deleted

## 2012-06-13 ENCOUNTER — Inpatient Hospital Stay (HOSPITAL_COMMUNITY)
Admission: AD | Admit: 2012-06-13 | Discharge: 2012-06-14 | Disposition: A | Discharge status: Home / Self Care | Source: Ambulatory Visit | Attending: Obstetrics and Gynecology | Admitting: Obstetrics and Gynecology

## 2012-06-13 DIAGNOSIS — O4700 False labor before 37 completed weeks of gestation, unspecified trimester: Secondary | ICD-10-CM | POA: Insufficient documentation

## 2012-06-13 DIAGNOSIS — O4703 False labor before 37 completed weeks of gestation, third trimester: Secondary | ICD-10-CM

## 2012-06-13 LAB — URINE MICROSCOPIC-ADD ON

## 2012-06-13 LAB — URINALYSIS, ROUTINE W REFLEX MICROSCOPIC
Glucose, UA: NEGATIVE mg/dL
Ketones, ur: NEGATIVE mg/dL
Leukocytes, UA: NEGATIVE
Nitrite: NEGATIVE
Specific Gravity, Urine: 1.01 (ref 1.005–1.030)
pH: 6 (ref 5.0–8.0)

## 2012-06-13 NOTE — MAU Note (Signed)
Pt states she thinks she is having cramping or contractions in her lower abd

## 2012-06-14 DIAGNOSIS — O479 False labor, unspecified: Secondary | ICD-10-CM

## 2012-06-14 NOTE — MAU Note (Signed)
The pt wasw discharged at 0044-another pt was put in the room and the monitor tracing that starts at 0216 is when the new patient started

## 2012-06-14 NOTE — MAU Provider Note (Signed)
Chief Complaint:  Contractions  First provider contact initiated 06/13/12 at 0025.     HPI: Katie Rose is a 22 y.o. [phone] at [redacted]w[redacted]d who presents to maternity admissions reporting ~ 5 UC's per hour. Denies contractions, leakage of fluid or vaginal bleeding. Good fetal movement.   Past Medical History: Past Medical History  Diagnosis Date  . No pertinent past medical history   . Ovarian cyst   . SAB (spontaneous abortion)     x2  . Anemia   . Preterm labor   . H/O oligohydramnios in prior pregnancy, currently pregnant     Past obstetric history: OB History   Grav Para Term Preterm Abortions TAB SAB Ect Mult Living   4 1 0 1 2  2   1      # Outc Date GA Lbr Len/2nd Wgt Sex Del Anes PTL Lv   1 PRE 2/06 [redacted]w[redacted]d  2.75kg(6lb1oz) F LTCS  No Yes   Comments: NRFHR with oligohydramnios   2 SAB 11/11           3 SAB 8/13           4 CUR               Past Surgical History: Past Surgical History  Procedure Laterality Date  . Laparoscopy    . Wisdom tooth extraction      x4  . Dilation and curettage of uterus      x 2  . Cesarean section      Family History: Family History  Problem Relation Age of Onset  . Other Neg Hx   . Alcohol abuse Father   . Cancer Maternal Grandmother     cervical  . Hypertension Mother     Social History: History  Substance Use Topics  . Smoking status: Never Smoker   . Smokeless tobacco: Never Used  . Alcohol Use: No    Allergies: No Known Allergies  Meds:  Prescriptions prior to admission  Medication Sig Dispense Refill  . cyclobenzaprine (FLEXERIL) 10 MG tablet Take 10 mg by mouth 3 (three) times daily as needed for muscle spasms.      . Prenatal Vit-Fe Fumarate-FA (PRENATAL MULTIVITAMIN) TABS Take 1 tablet by mouth daily.        ROS: Pertinent findings in history of present illness.  Physical Exam  Blood pressure 107/69, pulse 102, temperature 98.2 F (36.8 C), temperature source Oral, resp. rate 16, height 5\' 4"  (1.626 m),  weight 80.74 kg (178 lb), last menstrual period 11/15/2011, SpO2 100.00%.  GENERAL: Well-developed, well-nourished female in no acute distress.  HEENT: normocephalic HEART: Mild tachycardia RESP: normal effort ABDOMEN: Soft, non-tender, gravid appropriate for gestational age EXTREMITIES: Nontender, no edema NEURO: alert and oriented SPECULUM EXAM: NEFG, physiologic discharge, no blood, cervix clean Dilation: Closed Effacement (%): Thick Exam by:: V.Opel Lejeune,CNM  FHT:  Baseline 130 , moderate variability, accelerations present, no decelerations Contractions: irreg, mild   Labs: Results for orders placed during the hospital encounter of 06/13/12 (from the past 24 hour(s))  URINALYSIS, ROUTINE W REFLEX MICROSCOPIC     Status: Abnormal   Collection Time    06/13/12 11:20 PM      Result Value Range   Color, Urine YELLOW  YELLOW   APPearance CLEAR  CLEAR   Specific Gravity, Urine 1.010  1.005 - 1.030   pH 6.0  5.0 - 8.0   Glucose, UA NEGATIVE  NEGATIVE mg/dL   Hgb urine dipstick TRACE (*) NEGATIVE   Bilirubin  Urine NEGATIVE  NEGATIVE   Ketones, ur NEGATIVE  NEGATIVE mg/dL   Protein, ur NEGATIVE  NEGATIVE mg/dL   Urobilinogen, UA 0.2  0.0 - 1.0 mg/dL   Nitrite NEGATIVE  NEGATIVE   Leukocytes, UA NEGATIVE  NEGATIVE  URINE MICROSCOPIC-ADD ON     Status: Abnormal   Collection Time    06/13/12 11:20 PM      Result Value Range   Squamous Epithelial / LPF RARE  RARE   WBC, UA 0-2  <3 WBC/hpf   RBC / HPF 0-2  <3 RBC/hpf   Bacteria, UA FEW (*) RARE    Imaging:  CL 3.26 on 06/06/12  MAU Course:   Assessment: 1. Preterm uterine contractions, third trimester    Plan: Discharge home Preterm labor precautions and fetal kick counts Increase fluids and rest   Medication List    ASK your doctor about these medications       cyclobenzaprine 10 MG tablet  Commonly known as:  FLEXERIL  Take 10 mg by mouth 3 (three) times daily as needed for muscle spasms.     prenatal  multivitamin Tabs  Take 1 tablet by mouth daily.        [place], CNM 06/14/2012 12:41 AM

## 2012-06-14 NOTE — MAU Provider Note (Signed)
Attestation of Attending Supervision of Advanced Practitioner (CNM/NP): Evaluation and management procedures were performed by the Advanced Practitioner under my supervision and collaboration.  I have reviewed the Advanced Practitioner's note and chart, and I agree with the management and plan.  Jakyrie Totherow 06/14/2012 7:36 AM

## 2012-06-19 ENCOUNTER — Encounter: Admitting: Family Medicine

## 2012-06-27 ENCOUNTER — Encounter: Payer: Self-pay | Admitting: Obstetrics & Gynecology

## 2012-06-27 ENCOUNTER — Ambulatory Visit (INDEPENDENT_AMBULATORY_CARE_PROVIDER_SITE_OTHER): Admitting: Obstetrics & Gynecology

## 2012-06-27 VITALS — BP 101/76 | Wt 183.0 lb

## 2012-06-27 DIAGNOSIS — Z3483 Encounter for supervision of other normal pregnancy, third trimester: Secondary | ICD-10-CM

## 2012-06-27 DIAGNOSIS — N898 Other specified noninflammatory disorders of vagina: Secondary | ICD-10-CM

## 2012-06-27 DIAGNOSIS — O9989 Other specified diseases and conditions complicating pregnancy, childbirth and the puerperium: Secondary | ICD-10-CM

## 2012-06-27 DIAGNOSIS — Z3480 Encounter for supervision of other normal pregnancy, unspecified trimester: Secondary | ICD-10-CM

## 2012-06-27 NOTE — Progress Notes (Signed)
Amniotest negative. Discharge is white and scant, wet prep obtained; will follow up results and manage accordingly.  Unchanged cervical exam, patient reassured.  Advised Tylenol prn headaches, may also benefit from antihistamine given that she has puffy eyes too and likely has seasonal allergies.  Recommended Flexeril prn pelvic discomfort, fetal movement and labor precautions reviewed.  TSH negative from last visit.  Continue to monitor symptoms closely.

## 2012-06-27 NOTE — Progress Notes (Signed)
Patient is having increased swelling, headaches, cramping, and increased watery discharge all gradually getting worse and most significant over the past 3 days.

## 2012-06-27 NOTE — Addendum Note (Signed)
Addended by: Barbara Cower on: 06/27/2012 02:25 PM   Modules accepted: Orders

## 2012-06-27 NOTE — Patient Instructions (Signed)
Return to clinic for any obstetric concerns or go to MAU for evaluation  

## 2012-06-28 LAB — WET PREP FOR TRICH, YEAST, CLUE: Clue Cells Wet Prep HPF POC: NONE SEEN

## 2012-07-11 ENCOUNTER — Ambulatory Visit (INDEPENDENT_AMBULATORY_CARE_PROVIDER_SITE_OTHER): Admitting: Obstetrics & Gynecology

## 2012-07-11 ENCOUNTER — Encounter: Payer: Self-pay | Admitting: Obstetrics & Gynecology

## 2012-07-11 VITALS — BP 107/75 | Wt 184.0 lb

## 2012-07-11 DIAGNOSIS — Z3480 Encounter for supervision of other normal pregnancy, unspecified trimester: Secondary | ICD-10-CM

## 2012-07-11 DIAGNOSIS — O34219 Maternal care for unspecified type scar from previous cesarean delivery: Secondary | ICD-10-CM

## 2012-07-11 NOTE — Progress Notes (Signed)
Routine visit. Good FM. Some unchanged cramping. No ROM, VB, or CTXs. Cervical cultures at next visit.

## 2012-07-11 NOTE — Progress Notes (Signed)
Patient is still having increased headaches, she is utilizing extra strength tylenol.  She is also still feeling irregular contractions and some cramping.

## 2012-07-17 ENCOUNTER — Observation Stay: Payer: Self-pay

## 2012-07-17 LAB — URINALYSIS, COMPLETE
Bilirubin,UR: NEGATIVE
Blood: NEGATIVE
Glucose,UR: NEGATIVE mg/dL (ref 0–75)
Ph: 6 (ref 4.5–8.0)
WBC UR: 1 /HPF (ref 0–5)

## 2012-07-22 ENCOUNTER — Ambulatory Visit (INDEPENDENT_AMBULATORY_CARE_PROVIDER_SITE_OTHER): Admitting: Obstetrics & Gynecology

## 2012-07-22 VITALS — BP 106/85 | Wt 186.0 lb

## 2012-07-22 DIAGNOSIS — O9989 Other specified diseases and conditions complicating pregnancy, childbirth and the puerperium: Secondary | ICD-10-CM

## 2012-07-22 DIAGNOSIS — O26899 Other specified pregnancy related conditions, unspecified trimester: Secondary | ICD-10-CM | POA: Insufficient documentation

## 2012-07-22 DIAGNOSIS — G43B0 Ophthalmoplegic migraine, not intractable: Secondary | ICD-10-CM | POA: Insufficient documentation

## 2012-07-22 DIAGNOSIS — G43809 Other migraine, not intractable, without status migrainosus: Secondary | ICD-10-CM

## 2012-07-22 DIAGNOSIS — O26893 Other specified pregnancy related conditions, third trimester: Secondary | ICD-10-CM

## 2012-07-22 MED ORDER — BUTALBITAL-APAP-CAFFEINE 50-500-40 MG PO TABS: 2.0000 | ORAL_TABLET | Freq: Four times a day (QID) | ORAL | Status: DC | PRN

## 2012-07-22 MED ORDER — ONDANSETRON HCL 4 MG PO TABS: 4.0000 mg | ORAL_TABLET | Freq: Three times a day (TID) | ORAL | Status: DC | PRN

## 2012-07-22 NOTE — Patient Instructions (Signed)
Fetal Movement Counts Patient Name: __________________________________________________ Patient Due Date: ____________________ Performing a fetal movement count is highly recommended in high-risk pregnancies, but it is good for every pregnant woman to do. Your caregiver may ask you to start counting fetal movements at 28 weeks of the pregnancy. Fetal movements often increase:  After eating a full meal.  After physical activity.  After eating or drinking something sweet or cold.  At rest. Pay attention to when you feel the baby is most active. This will help you notice a pattern of your baby's sleep and wake cycles and what factors contribute to an increase in fetal movement. It is important to perform a fetal movement count at the same time each day when your baby is normally most active.  HOW TO COUNT FETAL MOVEMENTS 1. Find a quiet and comfortable area to sit or lie down on your left side. Lying on your left side provides the best blood and oxygen circulation to your baby. 2. Write down the day and time on a sheet of paper or in a journal. 3. Start counting kicks, flutters, swishes, rolls, or jabs in a 2 hour period. You should feel at least 10 movements within 2 hours. 4. If you do not feel 10 movements in 2 hours, wait 2 3 hours and count again. Look for a change in the pattern or not enough counts in 2 hours. SEEK MEDICAL CARE IF:  You feel less than 10 counts in 2 hours, tried twice.  There is no movement in over an hour.  The pattern is changing or taking longer each day to reach 10 counts in 2 hours.  You feel the baby is not moving as he or she usually does. Date: ____________ Movements: ____________ Start time: ____________ Finish time: ____________  Date: ____________ Movements: ____________ Start time: ____________ Finish time: ____________ Date: ____________ Movements: ____________ Start time: ____________ Finish time: ____________ Date: ____________ Movements: ____________  Start time: ____________ Finish time: ____________ Date: ____________ Movements: ____________ Start time: ____________ Finish time: ____________ Date: ____________ Movements: ____________ Start time: ____________ Finish time: ____________ Date: ____________ Movements: ____________ Start time: ____________ Finish time: ____________ Date: ____________ Movements: ____________ Start time: ____________ Finish time: ____________  Date: ____________ Movements: ____________ Start time: ____________ Finish time: ____________ Date: ____________ Movements: ____________ Start time: ____________ Finish time: ____________ Date: ____________ Movements: ____________ Start time: ____________ Finish time: ____________ Date: ____________ Movements: ____________ Start time: ____________ Finish time: ____________ Date: ____________ Movements: ____________ Start time: ____________ Finish time: ____________ Date: ____________ Movements: ____________ Start time: ____________ Finish time: ____________ Date: ____________ Movements: ____________ Start time: ____________ Finish time: ____________  Date: ____________ Movements: ____________ Start time: ____________ Finish time: ____________ Date: ____________ Movements: ____________ Start time: ____________ Finish time: ____________ Date: ____________ Movements: ____________ Start time: ____________ Finish time: ____________ Date: ____________ Movements: ____________ Start time: ____________ Finish time: ____________ Date: ____________ Movements: ____________ Start time: ____________ Finish time: ____________ Date: ____________ Movements: ____________ Start time: ____________ Finish time: ____________ Date: ____________ Movements: ____________ Start time: ____________ Finish time: ____________  Date: ____________ Movements: ____________ Start time: ____________ Finish time: ____________ Date: ____________ Movements: ____________ Start time: ____________ Finish time:  ____________ Date: ____________ Movements: ____________ Start time: ____________ Finish time: ____________ Date: ____________ Movements: ____________ Start time: ____________ Finish time: ____________ Date: ____________ Movements: ____________ Start time: ____________ Finish time: ____________ Date: ____________ Movements: ____________ Start time: ____________ Finish time: ____________ Date: ____________ Movements: ____________ Start time: ____________ Finish time: ____________  Date: ____________ Movements: ____________ Start time: ____________ Finish   time: ____________ Date: ____________ Movements: ____________ Start time: ____________ Finish time: ____________ Date: ____________ Movements: ____________ Start time: ____________ Finish time: ____________ Date: ____________ Movements: ____________ Start time: ____________ Finish time: ____________ Date: ____________ Movements: ____________ Start time: ____________ Finish time: ____________ Date: ____________ Movements: ____________ Start time: ____________ Finish time: ____________ Date: ____________ Movements: ____________ Start time: ____________ Finish time: ____________  Date: ____________ Movements: ____________ Start time: ____________ Finish time: ____________ Date: ____________ Movements: ____________ Start time: ____________ Finish time: ____________ Date: ____________ Movements: ____________ Start time: ____________ Finish time: ____________ Date: ____________ Movements: ____________ Start time: ____________ Finish time: ____________ Date: ____________ Movements: ____________ Start time: ____________ Finish time: ____________ Date: ____________ Movements: ____________ Start time: ____________ Finish time: ____________ Date: ____________ Movements: ____________ Start time: ____________ Finish time: ____________  Date: ____________ Movements: ____________ Start time: ____________ Finish time: ____________ Date: ____________ Movements:  ____________ Start time: ____________ Finish time: ____________ Date: ____________ Movements: ____________ Start time: ____________ Finish time: ____________ Date: ____________ Movements: ____________ Start time: ____________ Finish time: ____________ Date: ____________ Movements: ____________ Start time: ____________ Finish time: ____________ Date: ____________ Movements: ____________ Start time: ____________ Finish time: ____________ Date: ____________ Movements: ____________ Start time: ____________ Finish time: ____________  Date: ____________ Movements: ____________ Start time: ____________ Finish time: ____________ Date: ____________ Movements: ____________ Start time: ____________ Finish time: ____________ Date: ____________ Movements: ____________ Start time: ____________ Finish time: ____________ Date: ____________ Movements: ____________ Start time: ____________ Finish time: ____________ Date: ____________ Movements: ____________ Start time: ____________ Finish time: ____________ Date: ____________ Movements: ____________ Start time: ____________ Finish time: ____________ Document Released: 03/01/2006 Document Revised: 01/17/2012 Document Reviewed: 11/27/2011 [employer] Patient Information 2014 [employer].  

## 2012-07-22 NOTE — Progress Notes (Signed)
P = 98 

## 2012-07-22 NOTE — Progress Notes (Signed)
Patient was added to the schedule today for evaluation of headaches and nausea.  Reports having headache with a  7/10 pain scale associated with loss of vision in right eye for ten minutes on 07/21/12; headaches have been going on for several days.  Headaches not alleviated with extra strength acetaminophen. She has history of such headaches/migraines for several years prior to pregnancy, and loses vision in the R eye for 2-5 minutes, not as long as she did on 07/21/12.  The headaches are associated with nausea not alleviated by Phenergan.  Curbsided a neurologist; the diagnosis was of ophthalmoplegic migraine; recommended treating the headaches as usual and giving antiemetics as needed. No urgent imaging indicated for now.  Fioricet and Zofran prescribed. She was referred to Jannifer Rodney, NP, Headache specialist for further evaluation; will also be referred to Neurology.  She was advised to go to [facility] ED for evaluation if migraine symptoms/loss of vision worsens; and to go to [facility] for any obstetric concerns.  Fetal movement and labor precautions reviewed.   Patient will return on 07/26/12 for already scheduled OB visit and pelvic cultures.

## 2012-07-26 ENCOUNTER — Encounter: Admitting: Obstetrics & Gynecology

## 2012-07-26 ENCOUNTER — Encounter: Payer: Self-pay | Admitting: Obstetrics & Gynecology

## 2012-07-26 ENCOUNTER — Ambulatory Visit (INDEPENDENT_AMBULATORY_CARE_PROVIDER_SITE_OTHER): Admitting: Obstetrics & Gynecology

## 2012-07-26 VITALS — BP 106/76 | Wt 185.0 lb

## 2012-07-26 DIAGNOSIS — Z3483 Encounter for supervision of other normal pregnancy, third trimester: Secondary | ICD-10-CM

## 2012-07-26 DIAGNOSIS — Z3480 Encounter for supervision of other normal pregnancy, unspecified trimester: Secondary | ICD-10-CM

## 2012-07-26 MED ORDER — BUTALBITAL-APAP-CAFFEINE 50-500-40 MG PO TABS: 2.0000 | ORAL_TABLET | Freq: Four times a day (QID) | ORAL | Status: DC | PRN

## 2012-07-26 NOTE — Progress Notes (Signed)
P - Pt is complaining of being dizzy today - Pt states she has more pain/pressure in vaginal area and had "pinkish" discharge yesterday

## 2012-07-26 NOTE — Progress Notes (Signed)
Routine visit. Good FM. Labor precautions reviewed. Cervical cultures done today. She reports that she did not get her Fiorcet filled because her pharmacist said "I would never let my pregnant wife take that." She still has her heachache. I explained that as OBs, our goal is a healthy mom AND healthy baby, and that we would not prescribe anything to hurt her baby. She has requested the same prescription, but to a different pharmacy.

## 2012-07-28 ENCOUNTER — Inpatient Hospital Stay (HOSPITAL_COMMUNITY)
Admission: AD | Admit: 2012-07-28 | Discharge: 2012-07-28 | Disposition: A | Discharge status: Home / Self Care | Source: Ambulatory Visit | Attending: Obstetrics & Gynecology | Admitting: Obstetrics & Gynecology

## 2012-07-28 ENCOUNTER — Encounter (HOSPITAL_COMMUNITY): Payer: Self-pay | Admitting: *Deleted

## 2012-07-28 DIAGNOSIS — N949 Unspecified condition associated with female genital organs and menstrual cycle: Secondary | ICD-10-CM | POA: Insufficient documentation

## 2012-07-28 DIAGNOSIS — N898 Other specified noninflammatory disorders of vagina: Secondary | ICD-10-CM

## 2012-07-28 DIAGNOSIS — O99891 Other specified diseases and conditions complicating pregnancy: Secondary | ICD-10-CM | POA: Insufficient documentation

## 2012-07-28 LAB — WET PREP, GENITAL
Clue Cells Wet Prep HPF POC: NONE SEEN
Trich, Wet Prep: NONE SEEN

## 2012-07-28 LAB — POCT FERN TEST: POCT Fern Test: NEGATIVE

## 2012-07-28 NOTE — MAU Provider Note (Signed)
History     CSN: [id number]  Arrival date and time: 07/28/12 1017   First Provider Initiated Contact with Patient 07/28/12 1107      Chief Complaint  Patient presents with  . Vaginal Discharge   HPI Katie Rose is a 22 y.o. [id number] at [redacted]w[redacted]d who presents with complaints of leaking fluid.  Patient reports that she has been leaking fluid since yesterday morning.  She reports that she has noticed wetness on her panty/panty liner particularly after urination.  She denies gush of fluid.  She also denies vaginal bleeding and contractions.  She reports good fetal movement.   ROS: Per HPI with the following additions: patient notes vaginal discharge.  Past Medical History  Diagnosis Date  . No pertinent past medical history   . Ovarian cyst   . SAB (spontaneous abortion)     x2  . Anemia   . Preterm labor   . H/O oligohydramnios in prior pregnancy, currently pregnant     Past Surgical History  Procedure Laterality Date  . Laparoscopy    . Wisdom tooth extraction      x4  . Dilation and curettage of uterus      x 2  . Cesarean section      Family History  Problem Relation Age of Onset  . Other Neg Hx   . Alcohol abuse Father   . Cancer Maternal Grandmother     cervical  . Hypertension Mother     History  Substance Use Topics  . Smoking status: Never Smoker   . Smokeless tobacco: Never Used  . Alcohol Use: No    Allergies: No Known Allergies  Prescriptions prior to admission  Medication Sig Dispense Refill  . acetaminophen (TYLENOL) 500 MG tablet Take 500 mg by mouth every 6 (six) hours as needed for pain.      . butalbital-acetaminophen-caffeine (ESGIC PLUS) 50-500-40 MG per tablet Take 2 tablets by mouth every 6 (six) hours as needed for pain or headache.  30 tablet  1  . cyclobenzaprine (FLEXERIL) 10 MG tablet Take 10 mg by mouth 3 (three) times daily as needed for muscle spasms.      . ondansetron (ZOFRAN) 4 MG tablet Take 1 tablet (4 mg total) by mouth  every 8 (eight) hours as needed for nausea.  20 tablet  4  . Prenatal Vit-Fe Fumarate-FA (PRENATAL MULTIVITAMIN) TABS Take 1 tablet by mouth daily.        ROS Per HPI - see above. Physical Exam   Blood pressure 111/65, pulse 98, temperature 98.1 F (36.7 C), resp. rate 18, height 5\' 5"  (1.651 m), weight 83.915 kg (185 lb), last menstrual period 11/15/2011.  Physical Exam Gen: well appearing. NAD. Heart: RRR. No murmurs, rubs, or gallops. Lungs: CTAB. Abd: gravid but otherwise soft, nontender to palpation Ext: no appreciable lower extremity edema bilaterally Neuro: No focal deficits. GU: normal appearing external genitalia Pelvic exam: No pooling noted.  No bleeding noted.  Minimal white vaginal discharge noted.  Cervix normal in appearance and appears closed.  Cervical exam on Friday (closed/50/-2)  FHR: baseline 150, mod variability, 15x15 accels, no decels Toco: No contractions noted.  MAU Course  Procedures  Assessment and Plan  Katie Rose is a 22 y.o. [id number] at [redacted]w[redacted]d who presents with complaints of leaking fluid. - Fern negative x 2, No pooling seen on exam. - Category 1 FH tracing with no contractions noted. - Wet prep obtained given vaginal discharge and was negative. - Will discharge  home following results. Patient to continue regular follow up with [facility].   [name] Other 07/28/2012, 11:07 AM   Evaluation and management procedures were performed by Resident physician under my supervision/collaboration. Chart reviewed, patient examined by me and I agree with management and plan. Danae Orleans, CNM 07/28/2012 7:53 PM

## 2012-07-28 NOTE — MAU Provider Note (Signed)

## 2012-07-28 NOTE — MAU Note (Signed)
Pt reports her panty liners have been more wet and changing them more feqently. Thinks her water may have broken. Had some ctx yesterday. Haivng some irregular today. Fetal movement a little less than usual.

## 2012-07-29 ENCOUNTER — Encounter: Payer: Self-pay | Admitting: Obstetrics & Gynecology

## 2012-07-30 ENCOUNTER — Institutional Professional Consult (permissible substitution): Admitting: Nurse Practitioner

## 2012-07-31 ENCOUNTER — Ambulatory Visit (INDEPENDENT_AMBULATORY_CARE_PROVIDER_SITE_OTHER): Admitting: Obstetrics & Gynecology

## 2012-07-31 VITALS — BP 105/73 | Wt 186.0 lb

## 2012-07-31 DIAGNOSIS — O34219 Maternal care for unspecified type scar from previous cesarean delivery: Secondary | ICD-10-CM

## 2012-07-31 DIAGNOSIS — Z3480 Encounter for supervision of other normal pregnancy, unspecified trimester: Secondary | ICD-10-CM

## 2012-07-31 DIAGNOSIS — G43809 Other migraine, not intractable, without status migrainosus: Secondary | ICD-10-CM

## 2012-07-31 DIAGNOSIS — G43B0 Ophthalmoplegic migraine, not intractable: Secondary | ICD-10-CM

## 2012-07-31 DIAGNOSIS — O9989 Other specified diseases and conditions complicating pregnancy, childbirth and the puerperium: Secondary | ICD-10-CM

## 2012-07-31 DIAGNOSIS — O26893 Other specified pregnancy related conditions, third trimester: Secondary | ICD-10-CM

## 2012-07-31 NOTE — Patient Instructions (Signed)
Return to clinic for any obstetric concerns or go to MAU for evaluation  

## 2012-07-31 NOTE — Progress Notes (Signed)
P - 81 

## 2012-07-31 NOTE — Progress Notes (Signed)
Still having the opthalmoplegic headache; started Fioricet on 07/27/12.  Episodes are sporadic, length of time of migraines is not getting worse, scheduled to see headache specialist on 08/06/12.  Patient desires RCS, consent resigned 07/31/12.  Will consider TOLAC if she goes into spontaneous labor before her scheduled RCS. Surgical order sent in.  No other complaints or concerns.  Fetal movement and labor precautions reviewed.

## 2012-08-03 LAB — GC/CHLAMYDIA PROBE AMP: Neisseria gonorrhoeae by PCR: NEGATIVE

## 2012-08-03 LAB — STREP B SCREEN, VAGINAL / RECTAL: Strep Gp B Cult/DNA Probe: NEGATIVE

## 2012-08-04 ENCOUNTER — Encounter (HOSPITAL_COMMUNITY): Payer: Self-pay | Admitting: Pharmacy Technician

## 2012-08-06 ENCOUNTER — Ambulatory Visit (INDEPENDENT_AMBULATORY_CARE_PROVIDER_SITE_OTHER): Admitting: Obstetrics and Gynecology

## 2012-08-06 ENCOUNTER — Encounter: Payer: Self-pay | Admitting: Nurse Practitioner

## 2012-08-06 ENCOUNTER — Encounter: Payer: Self-pay | Admitting: Obstetrics and Gynecology

## 2012-08-06 ENCOUNTER — Ambulatory Visit (INDEPENDENT_AMBULATORY_CARE_PROVIDER_SITE_OTHER): Admitting: Nurse Practitioner

## 2012-08-06 VITALS — BP 100/69 | HR 83 | Wt 187.0 lb

## 2012-08-06 VITALS — BP 100/69 | Wt 187.0 lb

## 2012-08-06 DIAGNOSIS — Z3480 Encounter for supervision of other normal pregnancy, unspecified trimester: Secondary | ICD-10-CM

## 2012-08-06 DIAGNOSIS — O26893 Other specified pregnancy related conditions, third trimester: Secondary | ICD-10-CM

## 2012-08-06 DIAGNOSIS — G43B0 Ophthalmoplegic migraine, not intractable: Secondary | ICD-10-CM

## 2012-08-06 DIAGNOSIS — Z3483 Encounter for supervision of other normal pregnancy, third trimester: Secondary | ICD-10-CM

## 2012-08-06 DIAGNOSIS — O34219 Maternal care for unspecified type scar from previous cesarean delivery: Secondary | ICD-10-CM

## 2012-08-06 DIAGNOSIS — G43109 Migraine with aura, not intractable, without status migrainosus: Secondary | ICD-10-CM | POA: Insufficient documentation

## 2012-08-06 DIAGNOSIS — O9989 Other specified diseases and conditions complicating pregnancy, childbirth and the puerperium: Secondary | ICD-10-CM

## 2012-08-06 MED ORDER — PROMETHAZINE HCL 25 MG PO TABS: 25.0000 mg | ORAL_TABLET | Freq: Four times a day (QID) | ORAL | Status: DC | PRN

## 2012-08-06 NOTE — Progress Notes (Signed)
Diagnosis: Migraine with Aura. Pregnancy 37 weeks and 5 days  History: Pt is in office today for migraine consultation. She was diagnosed with Migraine with Aura about 2 years ago. Her mother and sister have migraine. She has been having a daily migraine/ or headache of some sort for the last 2 years. She is currently 37 weeks 5 days pregnant and has a planned C/S in one week. She will be delivering a baby boy. She plans to have a paraguard for birthcontrol after delivery. She is undecided on the breast/ bottle issue. She has issues with sleep. Having difficulty with falling asleep taking up to 4 hours and awaking early each morning.   Location: Bilateral Temples  Number of Headache days/month: Severe:7 Moderate:12 Mild:daily  Current Outpatient Prescriptions on File Prior to Visit  Medication Sig Dispense Refill  . acetaminophen (TYLENOL) 500 MG tablet Take 1,000 mg by mouth every 6 (six) hours as needed for pain.      . Prenatal Vit-Fe Fumarate-FA (PRENATAL MULTIVITAMIN) TABS Take 1 tablet by mouth daily.       No current facility-administered medications on file prior to visit.    Acute/ prevention: Ibuprofen, Advil, Tylenol, Fioricet,   Past Medical History  Diagnosis Date  . No pertinent past medical history   . Ovarian cyst   . SAB (spontaneous abortion)     x2  . Anemia   . Preterm labor   . H/O oligohydramnios in prior pregnancy, currently pregnant    Past Surgical History  Procedure Laterality Date  . Laparoscopy    . Wisdom tooth extraction      x4  . Dilation and curettage of uterus      x 2  . Cesarean section     Family History  Problem Relation Age of Onset  . Other Neg Hx   . Alcohol abuse Father   . Cancer Maternal Grandmother     cervical  . Hypertension Mother    Social History:  reports that she has never smoked. She has never used smokeless tobacco. She reports that she does not drink alcohol or use illicit drugs. Allergies: No Known  Allergies  Triggers: Stress, lack of sleep, loud noises  Birth control: Pregnant now, will use Paraguard IUD after delivery  ROS: Positive for back pain, common complaints of pregnancy, sleep issues including difficulty falling and staying asleep. Denies allergy and asthma  Exam: well developed, well nourished NAD  General: Pregnant HEENT: negative Cardiac:RRR Lungs:Clear Neuro:Negative Skin:Warm and dry  Impression:migraine - classic and chronic in pregnancy  Plan: We had a lengthy discussion about her migraines today. We are limited by what we can do as she is so close to her due date. 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 This should give her added benefit of helping her fall asleep. After delivery we will consider MRI, Start preventive, likely Effexor and give her a Triptan. She will RTC 4 weeks after delivery.  Time Spent: 45 MIN

## 2012-08-06 NOTE — Progress Notes (Signed)
Current OB patient here today for new consult regarding her headaches.

## 2012-08-06 NOTE — Progress Notes (Signed)
Patient doing well without complaints. Irregular contractions. Scheduled for repeat c-section on 7/2. Patient desires circumcision and is planning on using IUD for birth control. FM/labor precautions reviewed

## 2012-08-06 NOTE — Patient Instructions (Signed)
Migraine Headache A migraine headache is an intense, throbbing pain on one or both sides of your head. A migraine can last for 30 minutes to several hours. CAUSES  The exact cause of a migraine headache is not always known. However, a migraine may be caused when nerves in the brain become irritated and release chemicals that cause inflammation. This causes pain. SYMPTOMS  Pain on one or both sides of your head.  Pulsating or throbbing pain.  Severe pain that prevents daily activities.  Pain that is aggravated by any physical activity.  Nausea, vomiting, or both.  Dizziness.  Pain with exposure to bright lights, loud noises, or activity.  General sensitivity to bright lights, loud noises, or smells. Before you get a migraine, you may get warning signs that a migraine is coming (aura). An aura may include:  Seeing flashing lights.  Seeing bright spots, halos, or zig-zag lines.  Having tunnel vision or blurred vision.  Having feelings of numbness or tingling.  Having trouble talking.  Having muscle weakness. MIGRAINE TRIGGERS  Alcohol.  Smoking.  Stress.  Menstruation.  Aged cheeses.  Foods or drinks that contain nitrates, glutamate, aspartame, or tyramine.  Lack of sleep.  Chocolate.  Caffeine.  Hunger.  Physical exertion.  Fatigue.  Medicines used to treat chest pain (nitroglycerine), birth control pills, estrogen, and some blood pressure medicines. DIAGNOSIS  A migraine headache is often diagnosed based on:  Symptoms.  Physical examination.  A CT scan or MRI of your head. TREATMENT Medicines may be given for pain and nausea. Medicines can also be given to help prevent recurrent migraines.  HOME CARE INSTRUCTIONS  Only take over-the-counter or prescription medicines for pain or discomfort as directed by your caregiver. The use of long-term narcotics is not recommended.  Lie down in a dark, quiet room when you have a migraine.  Keep a journal  to find out what may trigger your migraine headaches. For example, write down:  What you eat and drink.  How much sleep you get.  Any change to your diet or medicines.  Limit alcohol consumption.  Quit smoking if you smoke.  Get 7 to 9 hours of sleep, or as recommended by your caregiver.  Limit stress.  Keep lights dim if bright lights bother you and make your migraines worse. SEEK IMMEDIATE MEDICAL CARE IF:   Your migraine becomes severe.  You have a fever.  You have a stiff neck.  You have vision loss.  You have muscular weakness or loss of muscle control.  You start losing your balance or have trouble walking.  You feel faint or pass out.  You have severe symptoms that are different from your first symptoms. MAKE SURE YOU:   Understand these instructions.  Will watch your condition.  Will get help right away if you are not doing well or get worse. Document Released: 01/30/2005 Document Revised: 04/24/2011 Document Reviewed: 01/20/2011 [employer] Patient Information 2014 [employer].  

## 2012-08-07 ENCOUNTER — Inpatient Hospital Stay (HOSPITAL_COMMUNITY)
Admission: AD | Admit: 2012-08-07 | Discharge: 2012-08-07 | Disposition: A | Discharge status: Home / Self Care | Source: Ambulatory Visit | Attending: Obstetrics & Gynecology | Admitting: Obstetrics & Gynecology

## 2012-08-07 ENCOUNTER — Encounter (HOSPITAL_COMMUNITY): Payer: Self-pay | Admitting: *Deleted

## 2012-08-07 ENCOUNTER — Ambulatory Visit (INDEPENDENT_AMBULATORY_CARE_PROVIDER_SITE_OTHER): Admitting: Family Medicine

## 2012-08-07 VITALS — BP 110/81 | Wt 185.0 lb

## 2012-08-07 DIAGNOSIS — M549 Dorsalgia, unspecified: Secondary | ICD-10-CM | POA: Insufficient documentation

## 2012-08-07 DIAGNOSIS — R197 Diarrhea, unspecified: Secondary | ICD-10-CM | POA: Insufficient documentation

## 2012-08-07 DIAGNOSIS — Z3483 Encounter for supervision of other normal pregnancy, third trimester: Secondary | ICD-10-CM

## 2012-08-07 DIAGNOSIS — O99891 Other specified diseases and conditions complicating pregnancy: Secondary | ICD-10-CM | POA: Insufficient documentation

## 2012-08-07 DIAGNOSIS — O34219 Maternal care for unspecified type scar from previous cesarean delivery: Secondary | ICD-10-CM

## 2012-08-07 DIAGNOSIS — R109 Unspecified abdominal pain: Secondary | ICD-10-CM | POA: Insufficient documentation

## 2012-08-07 DIAGNOSIS — Z3480 Encounter for supervision of other normal pregnancy, unspecified trimester: Secondary | ICD-10-CM

## 2012-08-07 NOTE — MAU Note (Signed)
PT SAYS  SHE IS SCH C/S FOR 7-2-  REPEAT,   FIRST BABY- C/S- DISTRESS,    BUT IF GOES INTO LABOR- THEN WILL DO TOL.- SIGNED PAPERS.   DENIES  HSV AND MRSA.  VE - 2 WEEKS AGO-  CLOSED AND 60%,  SAYS HAS HAD WATERY DIARRHEA ALL NIGHT.   DOESN'T THINK SHE IS  SROM- BUT SAYS HAS BEEN WET  SINCE 0100  SAYS GBS- NEG

## 2012-08-07 NOTE — Patient Instructions (Signed)
Breastfeeding A change in hormones during your pregnancy causes growth of your breast tissue and an increase in number and size of milk ducts. The hormone prolactin allows proteins, sugars, and fats from your blood supply to make breast milk in your milk-producing glands. The hormone progesterone prevents breast milk from being released before the birth of your baby. After the birth of your baby, your progesterone level decreases allowing breast milk to be released. Thoughts of your baby, as well as his or her sucking or crying, can stimulate the release of milk from the milk-producing glands. Deciding to breastfeed (nurse) is one of the best choices you can make for you and your baby. The information that follows gives a brief review of the benefits, as well as other important skills to know about breastfeeding. BENEFITS OF BREASTFEEDING For your baby  The first milk (colostrum) helps your baby's digestive system function better.   There are antibodies in your milk that help your baby fight off infections.   Your baby has a lower incidence of asthma, allergies, and sudden infant death syndrome (SIDS).   The nutrients in breast milk are better for your baby than infant formulas.  Breast milk improves your baby's brain development.   Your baby will have less gas, colic, and constipation.  Your baby is less likely to develop other conditions, such as childhood obesity, asthma, or diabetes mellitus. For you  Breastfeeding helps develop a very special bond between you and your baby.   Breastfeeding is convenient, always available at the correct temperature, and costs nothing.   Breastfeeding helps to burn calories and helps you lose the weight gained during pregnancy.   Breastfeeding makes your uterus contract back down to normal size faster and slows bleeding following delivery.   Breastfeeding mothers have a lower risk of developing osteoporosis or breast or ovarian cancer later  in life.  BREASTFEEDING FREQUENCY  A healthy, full-term baby may breastfeed as often as every hour or space his or her feedings to every 3 hours. Breastfeeding frequency will vary from baby to baby.   Newborns should be fed no less than every 2 3 hours during the day and every 4 5 hours during the night. You should breastfeed a minimum of 8 feedings in a 24 hour period.  Awaken your baby to breastfeed if it has been 3 4 hours since the last feeding.  Breastfeed when you feel the need to reduce the fullness of your breasts or when your newborn shows signs of hunger. Signs that your baby may be hungry include:  Increased alertness or activity.  Stretching.  Movement of the head from side to side.  Movement of the head and opening of the mouth when the corner of the mouth or cheek is stroked (rooting).  Increased sucking sounds, smacking lips, cooing, sighing, or squeaking.  Hand-to-mouth movements.  Increased sucking of fingers or hands.  Fussing.  Intermittent crying.  Signs of extreme hunger will require calming and consoling before you try to feed your baby. Signs of extreme hunger may include:  Restlessness.  A loud, strong cry.  Screaming.  Frequent feeding will help you make more milk and will help prevent problems, such as sore nipples and engorgement of the breasts.  BREASTFEEDING   Whether lying down or sitting, be sure that the baby's abdomen is facing your abdomen.   Support your breast with 4 fingers under your breast and your thumb above your nipple. Make sure your fingers are well away from   your nipple and your baby's mouth.   Stroke your baby's lips gently with your finger or nipple.   When your baby's mouth is open wide enough, place all of your nipple and as much of the colored area around your nipple (areola) as possible into your baby's mouth.  More areola should be visible above his or her upper lip than below his or her lower lip.  Your  baby's tongue should be between his or her lower gum and your breast.  Ensure that your baby's mouth is correctly positioned around the nipple (latched). Your baby's lips should create a seal on your breast.  Signs that your baby has effectively latched onto your nipple include:  Tugging or sucking without pain.  Swallowing heard between sucks.  Absent click or smacking sound.  Muscle movement above and in front of his or her ears with sucking.  Your baby must suck about 2 3 minutes in order to get your milk. Allow your baby to feed on each breast as long as he or she wants. Nurse your baby until he or she unlatches or falls asleep at the first breast, then offer the second breast.  Signs that your baby is full and satisfied include:  A gradual decrease in the number of sucks or complete cessation of sucking.  Falling asleep.  Extension or relaxation of his or her body.  Retention of a small amount of milk in his or her mouth.  Letting go of your breast by himself or herself.  Signs of effective breastfeeding in you include:  Breasts that have increased firmness, weight, and size prior to feeding.  Breasts that are softer after nursing.  Increased milk volume, as well as a change in milk consistency and color by the 5th day of breastfeeding.  Breast fullness relieved by breastfeeding.  Nipples are not sore, cracked, or bleeding.  If needed, break the suction by putting your finger into the corner of your baby's mouth and sliding your finger between his or her gums. Then, remove your breast from his or her mouth.  It is common for babies to spit up a small amount after a feeding.  Babies often swallow air during feeding. This can make babies fussy. Burping your baby between breasts can help with this.  Vitamin D supplements are recommended for babies who get only breast milk.  Avoid using a pacifier during your baby's first 4 6 weeks.  Avoid supplemental feedings of  water, formula, or juice in place of breastfeeding. Breast milk is all the food your baby needs. It is not necessary for your baby to have water or formula. Your breasts will make more milk if supplemental feedings are avoided during the early weeks. HOW TO TELL WHETHER YOUR BABY IS GETTING ENOUGH BREAST MILK Wondering whether or not your baby is getting enough milk is a common concern among mothers. You can be assured that your baby is getting enough milk if:   Your baby is actively sucking and you hear swallowing.   Your baby seems relaxed and satisfied after a feeding.   Your baby nurses at least 8 12 times in a 24 hour time period.  During the first 3 5 days of age:  Your baby is wetting at least 3 5 diapers in a 24 hour period. The urine should be clear and pale yellow.  Your baby is having at least 3 4 stools in a 24 hour period. The stool should be soft and yellow.  At   5 7 days of age, your baby is having at least 3 6 stools in a 24 hour period. The stool should be seedy and yellow by 5 days of age.  Your baby has a weight loss less than 7 10% during the first 3 days of age.  Your baby does not lose weight after 3 7 days of age.  Your baby gains 4 7 ounces each week after he or she is 4 days of age.  Your baby gains weight by 5 days of age and is back to birth weight within 2 weeks. ENGORGEMENT In the first week after your baby is born, you may experience extremely full breasts (engorgement). When engorged, your breasts may feel heavy, warm, or tender to the touch. Engorgement peaks within 24 48 hours after delivery of your baby.  Engorgement may be reduced by:  Continuing to breastfeed.  Increasing the frequency of breastfeeding.  Taking warm showers or applying warm, moist heat to your breasts just before each feeding. This increases circulation and helps the milk flow.   Gently massaging your breast before and during the feedings. With your fingertips, massage from  your chest wall towards your nipple in a circular motion.   Ensuring that your baby empties at least one breast at every feeding. It also helps to start the next feeding on the opposite breast.   Expressing breast milk by hand or by using a breast pump to empty the breasts if your baby is sleepy, or not nursing well. You may also want to express milk if you are returning to work oryou feel you are getting engorged.  Ensuring your baby is latched on and positioned properly while breastfeeding. If you follow these suggestions, your engorgement should improve in 24 48 hours. If you are still experiencing difficulty, call your lactation consultant or caregiver.  CARING FOR YOURSELF Take care of your breasts.  Bathe or shower daily.   Avoid using soap on your nipples.   Wear a supportive bra. Avoid wearing underwire style bras.  Air dry your nipples for a 3 4minutes after each feeding.   Use only cotton bra pads to absorb breast milk leakage. Leaking of breast milk between feedings is normal.   Use only pure lanolin on your nipples after nursing. You do not need to wash it off before feeding your baby again. Another option is to express a few drops of breast milk and gently massage that milk into your nipples.  Continue breast self-awareness checks. Take care of yourself.  Eat healthy foods. Alternate 3 meals with 3 snacks.  Avoid foods that you notice affect your baby in a bad way.  Drink milk, fruit juice, and water to satisfy your thirst (about 8 glasses a day).   Rest often, relax, and take your prenatal vitamins to prevent fatigue, stress, and anemia.  Avoid chewing and smoking tobacco.  Avoid alcohol and drug use.  Take over-the-counter and prescribed medicine only as directed by your caregiver or pharmacist. You should always check with your caregiver or pharmacist before taking any new medicine, vitamin, or herbal supplement.  Know that pregnancy is possible while  breastfeeding. If desired, talk to your caregiver about family planning and safe birth control methods that may be used while breastfeeding. SEEK MEDICAL CARE IF:   You feel like you want to stop breastfeeding or have become frustrated with breastfeeding.  You have painful breasts or nipples.  Your nipples are cracked or bleeding.  Your breasts are red, tender,   or warm.  You have a swollen area on either breast.  You have a fever or chills.  You have nausea or vomiting.  You have drainage from your nipples.  Your breasts do not become full before feedings by the 5th day after delivery.  You feel sad and depressed.  Your baby is too sleepy to eat well.  Your baby is having trouble sleeping.   Your baby is wetting less than 3 diapers in a 24 hour period.  Your baby has less than 3 stools in a 24 hour period.  Your baby's skin or the white part of his or her eyes becomes more yellow.   Your baby is not gaining weight by 5 days of age. MAKE SURE YOU:   Understand these instructions.  Will watch your condition.  Will get help right away if you are not doing well or get worse. Document Released: 01/30/2005 Document Revised: 10/25/2011 Document Reviewed: 09/06/2011 [employer] Patient Information 2014 [employer].  

## 2012-08-07 NOTE — Progress Notes (Signed)
P - 97 - Pt in a lot of pain today with pain scale 7/10

## 2012-08-07 NOTE — Assessment & Plan Note (Signed)
Labor precautions

## 2012-08-07 NOTE — Progress Notes (Signed)
Labor precautions.  Will take out of work.

## 2012-08-11 ENCOUNTER — Inpatient Hospital Stay (HOSPITAL_COMMUNITY)
Admission: AD | Admit: 2012-08-11 | Discharge: 2012-08-11 | Disposition: A | Discharge status: Home / Self Care | Source: Ambulatory Visit | Attending: Obstetrics & Gynecology | Admitting: Obstetrics & Gynecology

## 2012-08-11 ENCOUNTER — Encounter (HOSPITAL_COMMUNITY): Payer: Self-pay | Admitting: Family

## 2012-08-11 DIAGNOSIS — O99891 Other specified diseases and conditions complicating pregnancy: Secondary | ICD-10-CM | POA: Insufficient documentation

## 2012-08-11 DIAGNOSIS — O479 False labor, unspecified: Secondary | ICD-10-CM

## 2012-08-11 LAB — URINALYSIS, ROUTINE W REFLEX MICROSCOPIC
Ketones, ur: NEGATIVE mg/dL
Protein, ur: NEGATIVE mg/dL
Urobilinogen, UA: 0.2 mg/dL (ref 0.0–1.0)

## 2012-08-11 LAB — URINE MICROSCOPIC-ADD ON

## 2012-08-11 NOTE — MAU Note (Signed)
Pt presents with complaints of brownish vaginal discharge, she states that she has had to wear a pad today and the last time she went to the bathroom the pad  Was soaked. States baby is active.

## 2012-08-11 NOTE — MAU Provider Note (Signed)
  History     CSN: [id number]  Arrival date and time: 08/11/12 1746    Chief Complaint  Patient presents with  . Rupture of Membranes   HPI Katie Rose is a 22 y.o. [id number] at [redacted]w[redacted]d who presents with concern for ROM. Patient reports leaking brownish watery discharge today (1700).  She reports intermittent crampy abdominal pain which she feels are contractions.  She denies any bleeding.  Patient also reports headache and floaters.  [facility].   Past Medical History  Diagnosis Date  . No pertinent past medical history   . Ovarian cyst   . SAB (spontaneous abortion)     x2  . Anemia   . Preterm labor   . H/O oligohydramnios in prior pregnancy, currently pregnant     Past Surgical History  Procedure Laterality Date  . Laparoscopy    . Wisdom tooth extraction      x4  . Dilation and curettage of uterus      x 2  . Cesarean section      Family History  Problem Relation Age of Onset  . Other Neg Hx   . Alcohol abuse Father   . Cancer Maternal Grandmother     cervical  . Hypertension Mother     History  Substance Use Topics  . Smoking status: Never Smoker   . Smokeless tobacco: Never Used  . Alcohol Use: No    Allergies: No Known Allergies  Prescriptions prior to admission  Medication Sig Dispense Refill  . acetaminophen (TYLENOL) 500 MG tablet Take 1,000 mg by mouth every 6 (six) hours as needed for pain.      . butalbital-acetaminophen-caffeine (FIORICET, ESGIC) 50-325-40 MG per tablet Take 1 tablet by mouth 2 (two) times daily as needed for headache.      . Prenatal Vit-Fe Fumarate-FA (PRENATAL MULTIVITAMIN) TABS Take 1 tablet by mouth daily.      . promethazine (PHENERGAN) 25 MG tablet Take 1 tablet (25 mg total) by mouth every 6 (six) hours as needed for nausea.  30 tablet  1    ROS Per HPI with the following additions:  No RUQ. Physical Exam   Blood pressure 106/72, pulse 121, temperature 98.2 F (36.8 C),  temperature source Oral, resp. rate 18, height 5\' 3"  (1.6 m), weight 86.183 kg (190 lb), last menstrual period 11/15/2011, SpO2 98.00%.  Physical Exam Gen: well appearing, NAD. Heart: RRR Lungs: CTAB. Abd: gravid but otherwise soft, nontender to palpation Ext: no appreciable lower extremity edema bilaterally Neuro: no focal deficits. GU: normal appearing external genitalia Cervical exam: Dilation: 1 Effacement (%): 60 Cervical Position: Posterior Exam by:: Adriana Simas, MD  FHR: baseline 140, mod variability, 15x15 accels, no decels Toco: irregular, occasional UC.  MAU Course  Procedures  Assessment and Plan  22 y.o. [id number] at [redacted]w[redacted]d who presents with concern for ROM. - Amnisure negative - Cervix unchanged; patient not in labor. - Will discharge home with labor precautions.   [name] Other 08/11/2012, 7:23 PM   I agree with above resident note. I reviewed history, imaging, labs, and vitals. I personally reviewed the fetal heart tracing, and it is reactive. Napoleon Form, MD

## 2012-08-11 NOTE — MAU Note (Addendum)
Patient presents to MAU with c/o leaking brownish watery discharge at 1700 today. Reports +FM. Patient has hx of migraines, but today says h/a is different today.  Reports floaters; denies RUQ pain. Hx of oligo and c/s at 36 wks. Patient is scheduled for repeat c/s at 0930 on Wednesday 7/2. Desires TOLAC if ruptured or labor before scheduled c/s.

## 2012-08-12 NOTE — Pre-Procedure Instructions (Signed)
Spoke with [place] regarding preop orders for tomorrows PAT appt.  States she will have MD place them in the morning.

## 2012-08-13 ENCOUNTER — Encounter (HOSPITAL_COMMUNITY)
Admission: RE | Admit: 2012-08-13 | Discharge: 2012-08-13 | Disposition: A | Discharge status: Home / Self Care | Source: Ambulatory Visit | Attending: Obstetrics and Gynecology | Admitting: Obstetrics and Gynecology

## 2012-08-13 ENCOUNTER — Encounter (HOSPITAL_COMMUNITY): Payer: Self-pay

## 2012-08-13 VITALS — BP 100/75 | HR 95 | Resp 18 | Ht 63.0 in | Wt 190.0 lb

## 2012-08-13 DIAGNOSIS — O34219 Maternal care for unspecified type scar from previous cesarean delivery: Secondary | ICD-10-CM

## 2012-08-13 HISTORY — DX: Headache: R51

## 2012-08-13 HISTORY — DX: Diseases of the digestive system complicating pregnancy, unspecified trimester: O99.619

## 2012-08-13 HISTORY — DX: Gastro-esophageal reflux disease without esophagitis: K21.9

## 2012-08-13 LAB — URINE CULTURE: Colony Count: 6000

## 2012-08-13 LAB — CBC
MCH: 26.5 pg (ref 26.0–34.0)
MCHC: 32 g/dL (ref 30.0–36.0)
MCV: 82.8 fL (ref 78.0–100.0)
Platelets: 312 10*3/uL (ref 150–400)
RBC: 4.37 MIL/uL (ref 3.87–5.11)
RDW: 13.8 % (ref 11.5–15.5)

## 2012-08-13 LAB — RPR: RPR Ser Ql: NONREACTIVE

## 2012-08-13 LAB — TYPE AND SCREEN

## 2012-08-13 NOTE — Patient Instructions (Addendum)
   Your procedure is scheduled on:  Wednesday, July 2  Enter through the [employer] of [facility] at:  8 am Pick up the phone at the desk and dial [phone] and inform us of your arrival.  Please call this number if you have any problems the morning of surgery: [phone]  Remember: Do not eat or drink midnight: tonight - Tuesday 08/13/12 Take these medicines the morning of surgery with a SIP OF WATER:  None  Do not wear jewelry, make-up, or FINGER nail polish No metal in your hair or on your body. Do not wear lotions, powders, perfumes. You may wear deodorant.  Please use your CHG wash as directed prior to surgery.  Do not shave anywhere for at least 12 hours prior to first CHG shower.  Do not bring valuables to the hospital. Contacts, dentures or bridgework may not be worn into surgery.  Leave suitcase in the car. After Surgery it may be brought to your room. For patients being admitted to the hospital, checkout time is 11:00am the day of discharge.  Home With husband Katie Rose.

## 2012-08-14 ENCOUNTER — Encounter (HOSPITAL_COMMUNITY): Payer: Self-pay | Admitting: Anesthesiology

## 2012-08-14 ENCOUNTER — Encounter (HOSPITAL_COMMUNITY): Payer: Self-pay | Admitting: *Deleted

## 2012-08-14 ENCOUNTER — Inpatient Hospital Stay (HOSPITAL_COMMUNITY)
Admission: RE | Admit: 2012-08-14 | Discharge: 2012-08-17 | DRG: 766 | Disposition: A | Discharge status: Home / Self Care | Source: Ambulatory Visit | Attending: Obstetrics and Gynecology | Admitting: Obstetrics and Gynecology

## 2012-08-14 ENCOUNTER — Inpatient Hospital Stay (HOSPITAL_COMMUNITY): Admitting: Anesthesiology

## 2012-08-14 ENCOUNTER — Encounter (HOSPITAL_COMMUNITY)
Admission: RE | Disposition: A | Discharge status: Home / Self Care | Payer: Self-pay | Source: Ambulatory Visit | Attending: Obstetrics and Gynecology

## 2012-08-14 DIAGNOSIS — O34219 Maternal care for unspecified type scar from previous cesarean delivery: Secondary | ICD-10-CM

## 2012-08-14 DIAGNOSIS — Z3480 Encounter for supervision of other normal pregnancy, unspecified trimester: Secondary | ICD-10-CM

## 2012-08-14 SURGERY — Surgical Case
Anesthesia: Spinal | Site: Abdomen | Wound class: Clean Contaminated

## 2012-08-14 MED ORDER — METOCLOPRAMIDE HCL 5 MG/ML IJ SOLN: 10.0000 mg | Freq: Three times a day (TID) | INTRAMUSCULAR | Status: DC | PRN

## 2012-08-14 MED ORDER — NALBUPHINE HCL 10 MG/ML IJ SOLN
5.0000 mg | INTRAMUSCULAR | Status: DC | PRN
  Filled 2012-08-14: qty 1

## 2012-08-14 MED ORDER — SCOPOLAMINE 1 MG/3DAYS TD PT72
1.0000 | MEDICATED_PATCH | Freq: Once | TRANSDERMAL | Status: DC
  Filled 2012-08-14: qty 1

## 2012-08-14 MED ORDER — MORPHINE SULFATE (PF) 0.5 MG/ML IJ SOLN
INTRAMUSCULAR | Status: DC | PRN
  Administered 2012-08-14 (×2): 1 mg via INTRAVENOUS
  Administered 2012-08-14: 2.9 mg via INTRAVENOUS

## 2012-08-14 MED ORDER — OXYTOCIN 10 UNIT/ML IJ SOLN
40.0000 [IU] | INTRAVENOUS | Status: DC | PRN
  Administered 2012-08-14: 40 [IU] via INTRAVENOUS

## 2012-08-14 MED ORDER — DIPHENHYDRAMINE HCL 50 MG/ML IJ SOLN: 25.0000 mg | INTRAMUSCULAR | Status: DC | PRN

## 2012-08-14 MED ORDER — LACTATED RINGERS IV SOLN
INTRAVENOUS | Status: DC
  Administered 2012-08-14 (×4): via INTRAVENOUS

## 2012-08-14 MED ORDER — DIPHENHYDRAMINE HCL 25 MG PO CAPS
25.0000 mg | ORAL_CAPSULE | ORAL | Status: DC | PRN
  Administered 2012-08-15 – 2012-08-16 (×2): 25 mg via ORAL
  Filled 2012-08-14 (×2): qty 1

## 2012-08-14 MED ORDER — ZOLPIDEM TARTRATE 5 MG PO TABS: 5.0000 mg | ORAL_TABLET | Freq: Every evening | ORAL | Status: DC | PRN

## 2012-08-14 MED ORDER — FENTANYL CITRATE 0.05 MG/ML IJ SOLN
INTRAMUSCULAR | Status: AC
  Filled 2012-08-14: qty 2

## 2012-08-14 MED ORDER — NALOXONE HCL 1 MG/ML IJ SOLN
1.0000 ug/kg/h | INTRAMUSCULAR | Status: DC | PRN
  Filled 2012-08-14: qty 2

## 2012-08-14 MED ORDER — ONDANSETRON HCL 4 MG/2ML IJ SOLN
INTRAMUSCULAR | Status: DC | PRN
  Administered 2012-08-14: 4 mg via INTRAVENOUS

## 2012-08-14 MED ORDER — LACTATED RINGERS IV SOLN
INTRAVENOUS | Status: DC
  Administered 2012-08-14: 1000 mL via INTRAVENOUS

## 2012-08-14 MED ORDER — SIMETHICONE 80 MG PO CHEW
80.0000 mg | CHEWABLE_TABLET | ORAL | Status: DC | PRN
  Administered 2012-08-16: 80 mg via ORAL

## 2012-08-14 MED ORDER — BUPIVACAINE IN DEXTROSE 0.75-8.25 % IT SOLN
INTRATHECAL | Status: DC | PRN
  Administered 2012-08-14: 1.3 mL via INTRATHECAL

## 2012-08-14 MED ORDER — OXYTOCIN 40 UNITS IN LACTATED RINGERS INFUSION - SIMPLE MED: 62.5000 mL/h | INTRAVENOUS | Status: AC

## 2012-08-14 MED ORDER — SCOPOLAMINE 1 MG/3DAYS TD PT72: 1.0000 | MEDICATED_PATCH | Freq: Once | TRANSDERMAL | Status: DC

## 2012-08-14 MED ORDER — MEPERIDINE HCL 25 MG/ML IJ SOLN: 6.2500 mg | INTRAMUSCULAR | Status: DC | PRN

## 2012-08-14 MED ORDER — KETOROLAC TROMETHAMINE 60 MG/2ML IM SOLN: 60.0000 mg | Freq: Once | INTRAMUSCULAR | Status: AC | PRN

## 2012-08-14 MED ORDER — PHENYLEPHRINE HCL 10 MG/ML IJ SOLN
INTRAMUSCULAR | Status: DC | PRN
  Administered 2012-08-14: 80 ug via INTRAVENOUS
  Administered 2012-08-14: 40 ug via INTRAVENOUS
  Administered 2012-08-14: 80 ug via INTRAVENOUS

## 2012-08-14 MED ORDER — NALOXONE HCL 0.4 MG/ML IJ SOLN: 0.4000 mg | INTRAMUSCULAR | Status: DC | PRN

## 2012-08-14 MED ORDER — ONDANSETRON HCL 4 MG/2ML IJ SOLN: 4.0000 mg | Freq: Three times a day (TID) | INTRAMUSCULAR | Status: DC | PRN

## 2012-08-14 MED ORDER — DIBUCAINE 1 % RE OINT: 1.0000 "application " | TOPICAL_OINTMENT | RECTAL | Status: DC | PRN

## 2012-08-14 MED ORDER — SIMETHICONE 80 MG PO CHEW
80.0000 mg | CHEWABLE_TABLET | Freq: Three times a day (TID) | ORAL | Status: DC
  Administered 2012-08-14 – 2012-08-17 (×8): 80 mg via ORAL

## 2012-08-14 MED ORDER — EPHEDRINE SULFATE 50 MG/ML IJ SOLN
INTRAMUSCULAR | Status: DC | PRN
  Administered 2012-08-14 (×2): 10 mg via INTRAVENOUS
  Administered 2012-08-14 (×2): 15 mg via INTRAVENOUS

## 2012-08-14 MED ORDER — DIPHENHYDRAMINE HCL 25 MG PO CAPS: 25.0000 mg | ORAL_CAPSULE | Freq: Four times a day (QID) | ORAL | Status: DC | PRN

## 2012-08-14 MED ORDER — LACTATED RINGERS IV SOLN
Freq: Once | INTRAVENOUS | Status: AC
  Administered 2012-08-14: 09:00:00 via INTRAVENOUS

## 2012-08-14 MED ORDER — NALBUPHINE HCL 10 MG/ML IJ SOLN
5.0000 mg | INTRAMUSCULAR | Status: DC | PRN
  Administered 2012-08-14 – 2012-08-15 (×2): 10 mg via INTRAVENOUS
  Filled 2012-08-14 (×3): qty 1

## 2012-08-14 MED ORDER — MORPHINE SULFATE (PF) 0.5 MG/ML IJ SOLN
INTRAMUSCULAR | Status: DC | PRN
  Administered 2012-08-14: .1 mg via INTRATHECAL

## 2012-08-14 MED ORDER — MORPHINE SULFATE 0.5 MG/ML IJ SOLN
INTRAMUSCULAR | Status: AC
  Filled 2012-08-14: qty 10

## 2012-08-14 MED ORDER — FERROUS SULFATE 325 (65 FE) MG PO TABS
325.0000 mg | ORAL_TABLET | Freq: Two times a day (BID) | ORAL | Status: DC
  Administered 2012-08-14 – 2012-08-17 (×5): 325 mg via ORAL
  Filled 2012-08-14 (×5): qty 1

## 2012-08-14 MED ORDER — FENTANYL CITRATE 0.05 MG/ML IJ SOLN: 25.0000 ug | INTRAMUSCULAR | Status: DC | PRN

## 2012-08-14 MED ORDER — CEFAZOLIN SODIUM-DEXTROSE 2-3 GM-% IV SOLR
INTRAVENOUS | Status: AC
  Filled 2012-08-14: qty 50

## 2012-08-14 MED ORDER — CEFAZOLIN SODIUM-DEXTROSE 2-3 GM-% IV SOLR
2.0000 g | INTRAVENOUS | Status: AC
  Administered 2012-08-14: 2 g via INTRAVENOUS

## 2012-08-14 MED ORDER — FENTANYL CITRATE 0.05 MG/ML IJ SOLN
INTRAMUSCULAR | Status: DC | PRN
  Administered 2012-08-14: 15 ug via INTRATHECAL

## 2012-08-14 MED ORDER — TETANUS-DIPHTH-ACELL PERTUSSIS 5-2.5-18.5 LF-MCG/0.5 IM SUSP: 0.5000 mL | Freq: Once | INTRAMUSCULAR | Status: DC

## 2012-08-14 MED ORDER — FENTANYL CITRATE 0.05 MG/ML IJ SOLN
INTRAMUSCULAR | Status: DC | PRN
  Administered 2012-08-14 (×3): 50 ug via INTRAVENOUS
  Administered 2012-08-14: 35 ug via INTRAVENOUS

## 2012-08-14 MED ORDER — ACETAMINOPHEN 160 MG/5ML PO SOLN
975.0000 mg | Freq: Once | ORAL | Status: AC
  Administered 2012-08-14: 975 mg via ORAL
  Filled 2012-08-14 (×2): qty 40.6

## 2012-08-14 MED ORDER — KETOROLAC TROMETHAMINE 30 MG/ML IJ SOLN: 30.0000 mg | Freq: Four times a day (QID) | INTRAMUSCULAR | Status: AC | PRN

## 2012-08-14 MED ORDER — SCOPOLAMINE 1 MG/3DAYS TD PT72
MEDICATED_PATCH | TRANSDERMAL | Status: AC
  Administered 2012-08-14: 1.5 mg via TRANSDERMAL
  Filled 2012-08-14: qty 1

## 2012-08-14 MED ORDER — DIPHENHYDRAMINE HCL 50 MG/ML IJ SOLN: 12.5000 mg | INTRAMUSCULAR | Status: DC | PRN

## 2012-08-14 MED ORDER — ONDANSETRON HCL 4 MG PO TABS: 4.0000 mg | ORAL_TABLET | ORAL | Status: DC | PRN

## 2012-08-14 MED ORDER — LANOLIN HYDROUS EX OINT: 1.0000 "application " | TOPICAL_OINTMENT | CUTANEOUS | Status: DC | PRN

## 2012-08-14 MED ORDER — BISACODYL 10 MG RE SUPP: 10.0000 mg | Freq: Every day | RECTAL | Status: DC | PRN

## 2012-08-14 MED ORDER — FLEET ENEMA 7-19 GM/118ML RE ENEM: 1.0000 | ENEMA | Freq: Every day | RECTAL | Status: DC | PRN

## 2012-08-14 MED ORDER — SENNOSIDES-DOCUSATE SODIUM 8.6-50 MG PO TABS
2.0000 | ORAL_TABLET | Freq: Every day | ORAL | Status: DC
  Administered 2012-08-14 – 2012-08-17 (×3): 2 via ORAL

## 2012-08-14 MED ORDER — FENTANYL CITRATE 0.05 MG/ML IJ SOLN
INTRAMUSCULAR | Status: AC
  Administered 2012-08-14: 25 ug via INTRAVENOUS
  Filled 2012-08-14: qty 2

## 2012-08-14 MED ORDER — ONDANSETRON HCL 4 MG/2ML IJ SOLN
INTRAMUSCULAR | Status: AC
  Filled 2012-08-14: qty 2

## 2012-08-14 MED ORDER — KETOROLAC TROMETHAMINE 60 MG/2ML IM SOLN
INTRAMUSCULAR | Status: AC
  Administered 2012-08-14: 60 mg via INTRAMUSCULAR
  Filled 2012-08-14: qty 2

## 2012-08-14 MED ORDER — WITCH HAZEL-GLYCERIN EX PADS
1.0000 | MEDICATED_PAD | CUTANEOUS | Status: DC | PRN
Start: 2012-08-14 — End: 2012-08-17

## 2012-08-14 MED ORDER — BUTALBITAL-APAP-CAFFEINE 50-325-40 MG PO TABS: 1.0000 | ORAL_TABLET | Freq: Two times a day (BID) | ORAL | Status: DC | PRN

## 2012-08-14 MED ORDER — MEASLES, MUMPS & RUBELLA VAC ~~LOC~~ INJ
0.5000 mL | INJECTION | Freq: Once | SUBCUTANEOUS | Status: DC
  Filled 2012-08-14: qty 0.5

## 2012-08-14 MED ORDER — PRENATAL MULTIVITAMIN CH
1.0000 | ORAL_TABLET | Freq: Every day | ORAL | Status: DC
  Administered 2012-08-15 – 2012-08-17 (×3): 1 via ORAL
  Filled 2012-08-14 (×3): qty 1

## 2012-08-14 MED ORDER — OXYCODONE-ACETAMINOPHEN 5-325 MG PO TABS
1.0000 | ORAL_TABLET | ORAL | Status: DC | PRN
  Administered 2012-08-14 – 2012-08-15 (×3): 2 via ORAL
  Administered 2012-08-15 (×2): 1 via ORAL
  Administered 2012-08-15 – 2012-08-16 (×5): 2 via ORAL
  Administered 2012-08-17: 1 via ORAL
  Administered 2012-08-17: 2 via ORAL
  Administered 2012-08-17: 1 via ORAL
  Administered 2012-08-17: 2 via ORAL
  Filled 2012-08-14 (×5): qty 2
  Filled 2012-08-14: qty 1
  Filled 2012-08-14 (×2): qty 2
  Filled 2012-08-14 (×3): qty 1
  Filled 2012-08-14 (×3): qty 2

## 2012-08-14 MED ORDER — FENTANYL CITRATE 0.05 MG/ML IJ SOLN
25.0000 ug | INTRAMUSCULAR | Status: DC | PRN
  Administered 2012-08-14: 25 ug via INTRAVENOUS
  Administered 2012-08-14: 50 ug via INTRAVENOUS

## 2012-08-14 MED ORDER — SODIUM CHLORIDE 0.9 % IJ SOLN: 3.0000 mL | INTRAMUSCULAR | Status: DC | PRN

## 2012-08-14 MED ORDER — IBUPROFEN 600 MG PO TABS
600.0000 mg | ORAL_TABLET | Freq: Four times a day (QID) | ORAL | Status: DC
  Administered 2012-08-14 – 2012-08-17 (×12): 600 mg via ORAL
  Filled 2012-08-14 (×12): qty 1

## 2012-08-14 MED ORDER — ONDANSETRON HCL 4 MG/2ML IJ SOLN: 4.0000 mg | INTRAMUSCULAR | Status: DC | PRN

## 2012-08-14 MED ORDER — OXYTOCIN 10 UNIT/ML IJ SOLN
INTRAMUSCULAR | Status: AC
  Filled 2012-08-14: qty 4

## 2012-08-14 MED ORDER — MENTHOL 3 MG MT LOZG: 1.0000 | LOZENGE | OROMUCOSAL | Status: DC | PRN

## 2012-08-14 SURGICAL SUPPLY — 26 items
BARRIER ADHS 3X4 INTERCEED (GAUZE/BANDAGES/DRESSINGS) ×2 IMPLANT
BENZOIN TINCTURE PRP APPL 2/3 (GAUZE/BANDAGES/DRESSINGS) ×2 IMPLANT
CLAMP CORD UMBIL (MISCELLANEOUS) ×2 IMPLANT
DRAPE LG THREE QUARTER DISP (DRAPES) ×2 IMPLANT
DRSG OPSITE POSTOP 4X10 (GAUZE/BANDAGES/DRESSINGS) ×2 IMPLANT
DURAPREP 26ML APPLICATOR (WOUND CARE) ×2 IMPLANT
ELECT REM PT RETURN 9FT ADLT (ELECTROSURGICAL) ×2
ELECTRODE REM PT RTRN 9FT ADLT (ELECTROSURGICAL) ×1 IMPLANT
EXTRACTOR VACUUM M CUP 4 TUBE (SUCTIONS) IMPLANT
GLOVE BIO SURGEON STRL SZ 6.5 (GLOVE) ×2 IMPLANT
GLOVE BIOGEL PI IND STRL 6.5 (GLOVE) ×2 IMPLANT
GLOVE BIOGEL PI IND STRL 7.0 (GLOVE) ×2 IMPLANT
GLOVE BIOGEL PI INDICATOR 6.5 (GLOVE) ×2
GLOVE BIOGEL PI INDICATOR 7.0 (GLOVE) ×2
GLOVE SURG SS PI 6.0 STRL IVOR (GLOVE) ×2 IMPLANT
GOWN STRL NON-REIN LRG LVL3 (GOWN DISPOSABLE) ×2 IMPLANT
GOWN STRL REIN XL XLG (GOWN DISPOSABLE) ×4 IMPLANT
NS IRRIG 1000ML POUR BTL (IV SOLUTION) ×2 IMPLANT
PACK C SECTION WH (CUSTOM PROCEDURE TRAY) ×2 IMPLANT
PAD OB MATERNITY 4.3X12.25 (PERSONAL CARE ITEMS) ×2 IMPLANT
STRIP CLOSURE SKIN 1/2X4 (GAUZE/BANDAGES/DRESSINGS) ×2 IMPLANT
SUT PLAIN 2 0 XLH (SUTURE) ×2 IMPLANT
SUT VIC AB 0 CT1 36 (SUTURE) ×8 IMPLANT
SUT VIC AB 4-0 KS 27 (SUTURE) ×2 IMPLANT
TOWEL OR 17X24 6PK STRL BLUE (TOWEL DISPOSABLE) ×6 IMPLANT
TRAY FOLEY CATH 14FR (SET/KITS/TRAYS/PACK) ×2 IMPLANT

## 2012-08-14 NOTE — Progress Notes (Signed)
Patient c/o cramping pain 6/10. Had motrin po already at 1808. Had po tylenol at 1608. Patient not 12 hours out from duramorph at [phone]. Dr. Arby Barrette called and he states may give percocet now. Will continue to monitor patient.

## 2012-08-14 NOTE — Anesthesia Procedure Notes (Signed)
Spinal  Patient location during procedure: OR Start time: 08/14/2012 9:45 AM Staffing Performed by: anesthesiologist  Preanesthetic Checklist Completed: patient identified, site marked, surgical consent, pre-op evaluation, timeout performed, IV checked, risks and benefits discussed and monitors and equipment checked Spinal Block Patient position: sitting Prep: site prepped and draped and DuraPrep Patient monitoring: heart rate, cardiac monitor, continuous pulse ox and blood pressure Approach: midline Location: L3-4 Injection technique: single-shot Needle Needle type: Pencan  Needle gauge: 24 G Needle length: 9 cm Assessment Sensory level: T4 Additional Notes Clear free flow CSF on first attempt.  Transient paresthesia.  Patient tolerated procedure well with no apparent complications.  Jasmine December, MD

## 2012-08-14 NOTE — Anesthesia Postprocedure Evaluation (Signed)
  Anesthesia Post-op Note  Patient: Katie Rose  Procedure(s) Performed: Procedure(s): REPEAT CESAREAN SECTION (N/A)  Patient Location: Mother/Baby  Anesthesia Type:Epidural  Level of Consciousness: awake, alert  and oriented  Airway and Oxygen Therapy: Patient Spontanous Breathing  Post-op Pain: none  Post-op Assessment: Post-op Vital signs reviewed, Patient's Cardiovascular Status Stable, No headache, No backache, No residual numbness and No residual motor weakness  Post-op Vital Signs: Reviewed and stable  Complications: No apparent anesthesia complications

## 2012-08-14 NOTE — Transfer of Care (Signed)
Immediate Anesthesia Transfer of Care Note  Patient: Katie Rose  Procedure(s) Performed: Procedure(s): REPEAT CESAREAN SECTION (N/A)  Patient Location: PACU  Anesthesia Type:Spinal  Level of Consciousness: awake, alert , oriented and patient cooperative  Airway & Oxygen Therapy: Patient Spontanous Breathing  Post-op Assessment: Report given to PACU RN and Post -op Vital signs reviewed and stable  Post vital signs: Reviewed and stable  Complications: No apparent anesthesia complications

## 2012-08-14 NOTE — Anesthesia Postprocedure Evaluation (Signed)
  Anesthesia Post-op Note  Anesthesia Post Note  Patient: Katie Rose  Procedure(s) Performed: Procedure(s) (LRB): REPEAT CESAREAN SECTION (N/A)  Anesthesia type: Spinal  Patient location: PACU  Post pain: Pain level controlled  Post assessment: Post-op Vital signs reviewed  Last Vitals:  Filed Vitals:   08/14/12 1145  BP: 114/31  Pulse: 67  Temp:   Resp: 19    Post vital signs: Reviewed  Level of consciousness: awake  Complications: No apparent anesthesia complications

## 2012-08-14 NOTE — H&P (Signed)
Attestation of Attending Supervision of Advanced Practitioner (CNM/NP): Evaluation and management procedures were performed by the Advanced Practitioner under my supervision and collaboration.  I have reviewed the Advanced Practitioner's note and chart, and I agree with the management and plan.  Annlee Glandon 08/14/2012 9:30 AM

## 2012-08-14 NOTE — Op Note (Signed)
Rianna Hollister PROCEDURE DATE: 08/14/2012  PREOPERATIVE DIAGNOSIS: Intrauterine pregnancy at  [redacted]w[redacted]d weeks gestation; previous c-section, declined TOLAC  POSTOPERATIVE DIAGNOSIS: The same  PROCEDURE: Primary/Repeat Low Transverse Cesarean Section  SURGEON:  Catalina Antigua, MD  ASSISTANT:  Napoleon Form, MD   INDICATIONS: Katie Rose is a 22 y.o. Z6X0960 at [redacted]w[redacted]d here for cesarean section secondary to the indications listed under preoperative diagnosis; please see preoperative note for further details.  The risks of cesarean section were discussed with the patient including but were not limited to: bleeding which may require transfusion or reoperation; infection which may require antibiotics; injury to bowel, bladder, ureters or other surrounding organs; injury to the fetus; need for additional procedures including hysterectomy in the event of a life-threatening hemorrhage; placental abnormalities wth subsequent pregnancies, incisional problems, thromboembolic phenomenon and other postoperative/anesthesia complications.   The patient concurred with the proposed plan, giving informed written consent for the procedure.    FINDINGS:  Viable female infant in cephalic presentation.  Apgars 9 and 9.  Clear amniotic fluid.  Intact placenta, three vessel cord.  Normal uterus, fallopian tubes and ovaries bilaterally.  ANESTHESIA: Spinal INTRAVENOUS FLUIDS: 2300 ml ESTIMATED BLOOD LOSS: 700 ml URINE OUTPUT:  300 ml SPECIMENS: Placenta sent to L&D COMPLICATIONS: None immediate  PROCEDURE IN DETAIL:  The patient preoperatively received intravenous antibiotics and had sequential compression devices applied to her lower extremities.  She was then taken to the operating room where spinal anesthesia was administered and was found to be adequate. She was then placed in a dorsal supine position with a leftward tilt, and prepped and draped in a sterile manner.  A foley catheter was placed into her bladder and  attached to constant gravity.  After an adequate timeout was performed, a Pfannenstiel skin incision was made with scalpel and carried through to the underlying layer of fascia. The fascia was incised in the midline, and this incision was extended bilaterally using the Mayo scissors.  Kocher clamps were applied to the superior aspect of the fascial incision and the underlying rectus muscles were dissected off bluntly and with Mayo scissors. A similar process was carried out on the inferior aspect of the fascial incision. The rectus muscles were separated in the midline bluntly and the peritoneum was entered bluntly. A bladder flap was created and bladder blade placed. Attention was turned to the lower uterine segment where a  low transverse hysterotomy incision was made with a scalpel and extended bilaterally bluntly and with bandage scissors.  The infant was successfully delivered, the cord was clamped and cut and the infant was handed over to awaiting neonatology team. Uterine massage was then administered, and the placenta delivered intact with a three-vessel cord. The uterus was then cleared of clot and debris.  The hysterotomy was closed with 0 Vicryl in a running locked fashion, and an imbricating layer was also placed with the same suture. The pelvis was irrigated and cleared of all clot and debris. Hemostasis was confirmed on all surfaces.  The peritoneum and the muscles were reapproximated using 0 Vicryl in running fashion. The fascia was then closed using 0 Vicryl in a running fashion.  The subcutaneous layer was irrigated, then reapproximated with 2-0 plain suture in interrupted stitches. The skin was closed with a 4-0 Vicryl subcuticular stitch. The patient tolerated the procedure well. Sponge, lap, instrument and needle counts were correct x 2.  She was taken to the recovery room in stable condition.   Napoleon Form, MD 08/14/2012 11:07 AM

## 2012-08-14 NOTE — Anesthesia Preprocedure Evaluation (Addendum)
Anesthesia Evaluation  Patient identified by MRN, date of birth, ID band Patient awake    Reviewed: Allergy & Precautions, H&P , NPO status , Patient's Chart, lab work & pertinent test results, reviewed documented beta blocker date and time   History of Anesthesia Complications Negative for: history of anesthetic complications  Airway Mallampati: II TM Distance: >3 FB Neck ROM: full    Dental  (+)    Pulmonary neg pulmonary ROS,  breath sounds clear to auscultation        Cardiovascular negative cardio ROS  Rhythm:regular Rate:Normal     Neuro/Psych  Headaches (daily, takes phenergan and tylenol prn), negative psych ROS   GI/Hepatic Neg liver ROS, GERD- (with pregnancy, tums prn)  ,  Endo/Other  negative endocrine ROS  Renal/GU negative Renal ROS  negative genitourinary   Musculoskeletal   Abdominal   Peds  Hematology negative hematology ROS (+)   Anesthesia Other Findings   Reproductive/Obstetrics (+) Pregnancy (h/o c/s x1, for repeat)                          Anesthesia Physical Anesthesia Plan  ASA: II  Anesthesia Plan: Spinal   Post-op Pain Management:    Induction:   Airway Management Planned:   Additional Equipment:   Intra-op Plan:   Post-operative Plan:   Informed Consent: I have reviewed the patients History and Physical, chart, labs and discussed the procedure including the risks, benefits and alternatives for the proposed anesthesia with the patient or authorized representative who has indicated his/her understanding and acceptance.   Dental Advisory Given  Plan Discussed with: Surgeon and CRNA  Anesthesia Plan Comments:        Anesthesia Quick Evaluation

## 2012-08-14 NOTE — H&P (Signed)
Katie Rose is a 22 y.o. female presenting for elective repeat cesarean section.   Patient receives care at [facility] for [facility]. She had a prior 36-week delivery by C-section due to non-reassuring fetal heart tones. Her pregnancy has been unremarkable except for headaches. Her genetic screening and ultrasounds have been normal, and her 1-hour GTT was 107. She was planning TOLAC if she went into labor prior to 39 weeks.  History OB History   Grav Para Term Preterm Abortions TAB SAB Ect Mult Living   4 1 0 1 2  2   1      Past Medical History  Diagnosis Date  . No pertinent past medical history   . Ovarian cyst   . SAB (spontaneous abortion)     x2 - SAB x 1, and scar tissue removed from c/s incision site  . Preterm labor   . H/O oligohydramnios in prior pregnancy, currently pregnant   . Headache(784.0)   . Gastroesophageal reflux in pregancy     diet controlled and tums  . Anemia     history   Past Surgical History  Procedure Laterality Date  . Wisdom tooth extraction      x4  . Dilation and curettage of uterus      x 2  . Cesarean section  03/2004  . Laparoscopy      scar tissue from c/s incision site   Family History: family history includes Alcohol abuse in her father; Cancer in her maternal grandmother; and Hypertension in her mother.  There is no history of Other. Social History:  reports that she has never smoked. She has never used smokeless tobacco. She reports that she does not drink alcohol or use illicit drugs.   Prenatal Transfer Tool  Maternal Diabetes: No Genetic Screening: Normal Maternal Ultrasounds/Referrals: Normal Fetal Ultrasounds or other Referrals:  None Maternal Substance Abuse:  No Significant Maternal Medications:  Meds include: Other: Fioricet for headaches Significant Maternal Lab Results:  Lab values include: Group B Strep negative Other Comments:  None  ROS  See HPI    Last menstrual period 11/15/2011, not currently  breastfeeding.  Maternal Exam:  Abdomen: Patient reports no abdominal tenderness. Surgical scars: low transverse.     Fetal Exam Fetal Monitor Review: Mode: ultrasound and hand-held doppler probe.   Baseline rate: 138.      Filed Vitals:   08/14/12 0822  BP: 112/82  Pulse: 105  Temp: 98.1 F (36.7 C)  Resp: 16   Physical Exam  GEN:  WNWD, no distress HEENT:  NCAT, EOMI, conjunctiva clear CV: RRR, no murmur RESP:  CTAB ABD:  Soft, non-tender, no guarding or rebound, normal bowel sounds EXTREM:  Warm, well perfused, no edema or tenderness NEURO:  Alert, oriented, no focal deficits GU:  Deferred    Prenatal labs: ABO, Rh: --/--/O POS, O POS (07/01 1000) Antibody: NEG (07/01 1000) Rubella:   RPR: NON REACTIVE (07/01 1000)  HBsAg:    HIV:    GBS:   negative  Assessment/Plan: 22 y.o. Z6X0960 at [redacted]w[redacted]d here for elective repeat cesarean section - Preop labs done - hgb 11.6 - Antibiotics ordered - To OR when ready  Napoleon Form 08/14/2012, 6:53 AM

## 2012-08-15 ENCOUNTER — Encounter (HOSPITAL_COMMUNITY): Payer: Self-pay | Admitting: Obstetrics and Gynecology

## 2012-08-15 LAB — CBC
HCT: 27.8 % — ABNORMAL LOW (ref 36.0–46.0)
MCH: 25.6 pg — ABNORMAL LOW (ref 26.0–34.0)
MCV: 82.7 fL (ref 78.0–100.0)
Platelets: 291 10*3/uL (ref 150–400)
RDW: 14.1 % (ref 11.5–15.5)

## 2012-08-15 NOTE — Progress Notes (Signed)
Called Dr. Benjamin Stain regarding being unable to find patient's rubella status in prenatal record or labs. She states she and Dr. Thad Ranger will look into this.

## 2012-08-15 NOTE — Progress Notes (Signed)
Attending Circumcision Counseling Progress Note  Patient desires circumcision for her female infant.  Circumcision procedure details discussed, risks and benefits of procedure were also discussed.  These include but are not limited to: Benefits of circumcision in men include reduction in the rates of urinary tract infection (UTI), penile cancer, some sexually transmitted infections, penile inflammatory and retractile disorders, as well as easier hygiene.  Risks include bleeding , infection, injury of glans which may lead to penile deformity or urinary tract issues, unsatisfactory cosmetic appearance and other potential complications related to the procedure.  It was emphasized that this is an elective procedure.  Patient wants to proceed with circumcision; written informed consent obtained.  Will do circumcision soon, routine circumcision and post circumcision care ordered for the infant.  Jaynie Collins, M.D. 08/15/2012 12:03 PM

## 2012-08-15 NOTE — Progress Notes (Signed)
Subjective: Postpartum Day 1: Cesarean Delivery Patient reports incisional pain, tolerating PO and no problems voiding.    Objective: Vital signs in last 24 hours: Temp:  [97.4 F (36.3 C)-98.3 F (36.8 C)] 97.4 F (36.3 C) (07/03 0425) Pulse Rate:  [60-105] 60 (07/03 0425) Resp:  [15-26] 18 (07/03 0425) BP: (93-114)/(31-82) 95/61 mmHg (07/03 0425) SpO2:  [93 %-100 %] 96 % (07/03 0425) Weight:  [86.183 kg (190 lb)] 86.183 kg (190 lb) (07/02 1323)  Physical Exam:  General: alert, cooperative and no distress Lochia: appropriate Uterine Fundus: firm Incision: no significant drainage, honeycomb dressing intact and in place DVT Evaluation: No evidence of DVT seen on physical exam. Negative Homan's sign. No cords or calf tenderness. No significant calf/ankle edema.   Recent Labs  08/13/12 1000 08/15/12 0550  HGB 11.6* 8.6*  HCT 36.2 27.8*    Assessment/Plan: Status post Cesarean section. Doing well postoperatively.  Continue current care. Pt reports problems with latch during breastfeeding.  Encouraged pt to use skin to skin contact, encourage baby to latch every 2-3 hours while awake, hand express colostrum for baby before each feeding.  Lactation to see pt again today.   LEFTWICH-KIRBY, Arlynn Stare 08/15/2012, 7:23 AM

## 2012-08-16 LAB — CBC
HCT: 28 % — ABNORMAL LOW (ref 36.0–46.0)
MCH: 26.3 pg (ref 26.0–34.0)
MCV: 82.6 fL (ref 78.0–100.0)
Platelets: 281 10*3/uL (ref 150–400)
RDW: 14.4 % (ref 11.5–15.5)

## 2012-08-16 NOTE — Progress Notes (Signed)
PostOperative Day 2 Subjective: up ad lib, voiding, tolerating PO, + flatus and reports continued incisional pain and latch issues with left nipple inversion. Also complains of shortness of breath especially with ambulation, and dizziness.  Objective: Blood pressure 113/67, pulse 64, temperature 98.8 F (37.1 C), temperature source Oral, resp. rate 18, weight 86.183 kg (190 lb), last menstrual period 11/15/2011, SpO2 96.00%, unknown if currently breastfeeding.  Physical Exam:  General: alert, cooperative, appears stated age and no distress Lochia: appropriate Uterine Fundus: firm Incision: healing well, no significant drainage, no dehiscence, no significant erythema DVT Evaluation: No evidence of DVT seen on physical exam. No cords or calf tenderness. No significant calf/ankle edema. O2 saturation on check in room = 99%  Recent Labs  08/13/12 1000 08/15/12 0550  HGB 11.6* 8.6*  HCT 36.2 27.8*    Assessment/Plan: Status post c-section, doing well post-operative day 2 Continue current care Recheck Hgb with SOB and hgb trend from 11.6 to 8.6. Check ambulatory O2 sat and orthostatic vital signs (communicated to nursing). Pt is on ferrous sulfate 325po BID. Continue working with lactation for issues breastfeeding. Plan for discharge tomorrow, Breastfeeding and Contraception IUD    LOS: 2 days   Simone Curia 08/16/2012, 7:45 AM

## 2012-08-16 NOTE — Discharge Summary (Signed)
Obstetric Discharge Summary Reason for Admission: cesarean section Prenatal Procedures: none Intrapartum Procedures: cesarean: low cervical, transverse Postpartum Procedures: none Complications-Operative and Postpartum: none Hemoglobin  Date Value Range Status  08/16/2012 8.9* 12.0 - 15.0 g/dL Final     HCT  Date Value Range Status  08/16/2012 28.0* 36.0 - 46.0 % Final  Hospital Course: Katie Rose is a 22 y.o. female presenting for elective repeat cesarean section.  Patient receives care at [facility] for [facility]. She had a prior 36-week delivery by C-section due to non-reassuring fetal heart tones. Her pregnancy has been unremarkable except for headaches. Her genetic screening and ultrasounds have been normal, and her 1-hour GTT was 107. She was planning TOLAC if she went into labor prior to 39 weeks.   Katie Rose  PROCEDURE DATE: 08/14/2012  PREOPERATIVE DIAGNOSIS: Intrauterine pregnancy at [redacted]w[redacted]d weeks gestation; previous c-section, declined TOLAC  POSTOPERATIVE DIAGNOSIS: The same  PROCEDURE: Primary/Repeat Low Transverse Cesarean Section  SURGEON: Katie Antigua, MD  ASSISTANT: Katie Form, MD  INDICATIONS: Katie Rose is a 22 y.o. Z6X0960 at [redacted]w[redacted]d here for cesarean section secondary to the indications listed under preoperative diagnosis; please see preoperative note for further details. The risks of cesarean section were discussed with the patient including but were not limited to: bleeding which may require transfusion or reoperation; infection which may require antibiotics; injury to bowel, bladder, ureters or other surrounding organs; injury to the fetus; need for additional procedures including hysterectomy in the event of a life-threatening hemorrhage; placental abnormalities wth subsequent pregnancies, incisional problems, thromboembolic phenomenon and other postoperative/anesthesia complications. The patient concurred with the proposed plan, giving  informed written consent for the procedure.  FINDINGS: Viable female infant in cephalic presentation. Apgars 9 and 9. Clear amniotic fluid. Intact placenta, three vessel cord. Normal uterus, fallopian tubes and ovaries bilaterally.  ANESTHESIA: Spinal  She has done well postoperatively.  Baby was circumcised. She plans on IUD for contraception. She is deemed ready for discharge.   Physical Exam:  General: alert and no distress Lochia: appropriate Uterine Fundus: firm Incision: healing well, no significant drainage, no dehiscence DVT Evaluation: No evidence of DVT seen on physical exam.  Discharge Diagnoses: Term Pregnancy-delivered  Discharge Information: Date: 08/16/2012 Activity: unrestricted and pelvic rest Diet: routine Medications: Ibuprofen and Percocet Condition: stable Instructions: refer to practice specific booklet Discharge to: home  Newborn Data: Live born female  Birth Weight: 6 lb 9.1 oz (2980 g) APGAR: 9, 9  Home with mother.  Katie Rose 08/16/2012, 10:00 PM

## 2012-08-16 NOTE — Progress Notes (Signed)
I saw and examined patient and agree with above resident note. I reviewed history, delivery summary, labs and vitals. Kalani Sthilaire, MD  

## 2012-08-16 NOTE — Progress Notes (Signed)
UR chart review completed.  

## 2012-08-17 MED ORDER — OXYCODONE-ACETAMINOPHEN 5-325 MG PO TABS: 1.0000 | ORAL_TABLET | ORAL | Status: DC | PRN

## 2012-08-17 MED ORDER — IBUPROFEN 600 MG PO TABS: 600.0000 mg | ORAL_TABLET | Freq: Four times a day (QID) | ORAL | Status: DC

## 2012-08-18 NOTE — Discharge Summary (Signed)
Attestation of Attending Supervision of Advanced Practitioner (CNM/NP): Evaluation and management procedures were performed by the Advanced Practitioner under my supervision and collaboration. I have reviewed the Advanced Practitioner's note and chart, and I agree with the management and plan.  Mohammedali Bedoy H. 11:26 AM   

## 2012-09-10 ENCOUNTER — Encounter: Admitting: Nurse Practitioner

## 2012-09-10 DIAGNOSIS — R51 Headache: Secondary | ICD-10-CM

## 2012-09-24 ENCOUNTER — Encounter: Payer: Self-pay | Admitting: Obstetrics & Gynecology

## 2012-09-24 ENCOUNTER — Ambulatory Visit (INDEPENDENT_AMBULATORY_CARE_PROVIDER_SITE_OTHER): Admitting: Obstetrics & Gynecology

## 2012-09-24 VITALS — BP 125/83 | HR 69 | Ht 63.0 in | Wt 169.0 lb

## 2012-09-24 DIAGNOSIS — Z09 Encounter for follow-up examination after completed treatment for conditions other than malignant neoplasm: Secondary | ICD-10-CM

## 2012-09-24 NOTE — Progress Notes (Signed)
  Subjective:    Patient ID: Katie Rose, female    DOB: 1990/02/15, 22 y.o.   MRN: [id number]  HPI  She is here for her 6 week pp visit. She had a RLTCS and no complications. She breast fed for 3 weeks but then changed to bottlefeeding due to latching issues. The baby is growing well. She has not had sex yet. She wants an IUD for birth control asap. She reports some hematochezia with BMs. She reports that they are not hard ("squishy"). She denies anal sex. She reports normal bladder function. She denies any baby blues or pp depression symptoms.  Review of Systems She reports that her incision site has been numb for 8 years.     Objective:   Physical Exam  Well-healed incision External hemorrhoids noted      Assessment & Plan:  Pp/post op- stable  Hemorrhoids- I recommended OTC treatments. If this does not resolve her rectal bleeding, then she should make an appt with her FP/ general surgery. Contraception- IUD this week Preventative care- schedule annual in 1 month at which time we can re evaluate her anus

## 2012-09-25 ENCOUNTER — Ambulatory Visit (INDEPENDENT_AMBULATORY_CARE_PROVIDER_SITE_OTHER): Admitting: Obstetrics and Gynecology

## 2012-09-25 ENCOUNTER — Encounter: Payer: Self-pay | Admitting: Obstetrics and Gynecology

## 2012-09-25 VITALS — BP 120/81 | HR 80 | Ht 63.0 in | Wt 167.0 lb

## 2012-09-25 DIAGNOSIS — Z01812 Encounter for preprocedural laboratory examination: Secondary | ICD-10-CM

## 2012-09-25 DIAGNOSIS — Z30430 Encounter for insertion of intrauterine contraceptive device: Secondary | ICD-10-CM

## 2012-09-25 NOTE — Progress Notes (Signed)
Patient ID: Katie Rose, female   DOB: [birth year], 22 y.o.   MRN: [id number] IUD Procedure Note Patient identified, informed consent performed, signed copy in chart, time out was performed.  Urine pregnancy test negative.  Speculum placed in the vagina.  Cervix visualized.  Cleaned with Betadine x 2.  Grasped anteriorly with a single tooth tenaculum.  Uterus sounded to 7.5 cm.  Paraguard IUD placed per manufacturer's recommendations.  Strings trimmed to 3 cm. Tenaculum was removed, good hemostasis noted.  Patient tolerated procedure well.   Patient given post procedure instructions and Mirena care card with expiration date.  Patient is asked to check IUD strings periodically and follow up in 4-6 weeks for IUD check.

## 2012-11-14 ENCOUNTER — Ambulatory Visit: Admitting: Obstetrics & Gynecology

## 2012-11-21 ENCOUNTER — Ambulatory Visit: Admitting: Obstetrics and Gynecology

## 2012-11-21 DIAGNOSIS — Z01419 Encounter for gynecological examination (general) (routine) without abnormal findings: Secondary | ICD-10-CM

## 2012-11-21 DIAGNOSIS — Z30431 Encounter for routine checking of intrauterine contraceptive device: Secondary | ICD-10-CM

## 2013-03-28 ENCOUNTER — Ambulatory Visit: Payer: Self-pay | Admitting: Neurology

## 2013-06-18 ENCOUNTER — Ambulatory Visit: Payer: Self-pay | Admitting: Unknown Physician Specialty

## 2013-10-23 ENCOUNTER — Telehealth: Payer: Self-pay | Admitting: *Deleted

## 2013-10-23 NOTE — Telephone Encounter (Signed)
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  Patient was reassured that since she has never had any signs or symptoms of HSV in her vaginal area that the positive results were probably related to her fever blisters because it is in the same family of virus.  Patient is reassured and will let us know if she would like further testing.

## 2013-12-15 ENCOUNTER — Encounter: Payer: Self-pay | Admitting: Obstetrics and Gynecology

## 2014-01-16 ENCOUNTER — Ambulatory Visit: Payer: Self-pay | Admitting: Gastroenterology

## 2014-06-02 NOTE — Op Note (Signed)
PATIENT NAME:  Katie Rose, Davon MR#:  [id number] DATE OF BIRTH:  1990/04/03  DATE OF PROCEDURE:  09/23/2011  PREOPERATIVE DIAGNOSIS: Incomplete abortion.   POSTOPERATIVE DIAGNOSIS: Incomplete abortion.  PROCEDURE: Suction dilation and curettage.   SURGEON: Ricky L. Logan BoresEvans, MD  ANESTHESIA: IV.  ANTIBIOTICS: Ancef 1 gram given intraoperatively.   COMPLICATIONS: None.   SPECIMEN: Products of conception.   DRAINS: None.   PROCEDURE IN DETAIL: The patient has been followed intermittently over the past several weeks with suspected unsuccessful pregnancy and was seen by my midwife this week. Quants were drawn and her quantitative changed from 60 to 100 over the past month. Ultrasound today debris within the uterus. The patient was having some bleeding and cramping. I discussed the situation and recommended dilation and curettage, discussed with the patient and consent signed.   She was taken to the Operating Room and placed in the supine position, anesthesia was initiated and then she was placed in the dorsal lithotomy using Allen stirrups, prepped and draped in the usual sterile fashion. The cervix was visualized and grasped with a single-tooth tenaculum. It easily admitted a 76#18 [place] and then a #8 suction curette and the uterus was curetted without difficulty with immediate return of products of conception on application of suction. Gentle sharp curetting was used with small curette and good uterine cry was felt throughout. One additional pass with the suction curette returned minimal bloody blood and the cervix was firming. Instruments were removed. The cervix was seen to be hemostatic. The patient was returned to the supine position and left to the care of anesthesia.   She will be discharged home from recovery when she meets criteria. She will begin doxycycline 100 mg twice a day tomorrow and then take Vicodin as needed for pain. I see her back in two weeks, sooner if needed. Infection and  precautions were given.  ____________________________ Reatha Harpsicky L. Logan BoresEvans, MD rle:slb D: 09/23/2011 10:01:53 ET T: 09/23/2011 11:32:22 ET JOB#: [id number]  cc: Ricky L. Logan BoresEvans, MD, <Dictator> Augustina MoodICK L Audry Pecina MD ELECTRONICALLY SIGNED 09/25/2011 7:52

## 2014-06-02 NOTE — H&P (Signed)
PATIENT NAME:  Katie Rose, Katie Rose MR#:  [id number] DATE OF BIRTH:  [birth year]  DATE OF ADMISSION:  09/22/2011  HISTORY OF PRESENT ILLNESS: This is a 24 year old Hispanic female who has had what sounds like an ongoing miscarriage or blighted ovum for the past month. She has had some sketchy followup by [facility], but actually was seen this week by my midwife. The patient is not real clear on what she was told, but it sounds like she had a blighted ovum. Her quants are falling. Nothing special over the last 24 hours. She has had continued cramping with some morning clots that taper off at the end of the day.   PAST MEDICAL HISTORY:  1. Suction dilation and curettage for similar several years ago then laparoscopy. 2. Cesarean section.  DRUG ALLERGIES: No known drug allergies.   REVIEW OF SYSTEMS: Negative per routine, except as above.   PHYSICAL EXAMINATION:  VITALS: She is afebrile, vitals are stable.   ABDOMEN: Nontender.   LUNGS: Clear.   HEART: Regular.   LOWER EXTREMITIES: Nontender.   BACK: Nontender.   PELVIC: Cervix is closed.   IMPRESSION: Blighted ovum with incomplete abortion.   PLAN: Suction dilation and curettage. We will get that done today. I will call in some doxycycline with [facility] on [address] to take twice a day beginning tomorrow. Pelvic rest for two weeks. Followup with me in two weeks. She was given precautions to call for signs or symptoms of infection.  ____________________________ Reatha Harpsicky L. Logan BoresEvans, MD rle:slb D: 09/23/2011 08:34:00 ET T: 09/23/2011 09:40:26 ET JOB#: [id number]  cc: Ricky L. Logan BoresEvans, MD, <Dictator> Augustina MoodICK L Kaisen Ackers MD ELECTRONICALLY SIGNED 09/25/2011 7:52

## 2014-06-08 LAB — SURGICAL PATHOLOGY

## 2014-06-23 NOTE — H&P (Signed)
L&D Evaluation:  History:  HPI 24 yr old [id number] with EDC=08/21/2012 presents at 35 weeks with c/o  LOF since fall last night and decreased FM today. Fell on to buttock last night when going down stairs. Denies vaginal bleeding but has had some contractions. Has had some LOF last night then again this afternoon noticed more clear discharge on her pad. Has been checked in the office on 2-3 occasions for LOF/discharge in May. Treated for BV in April.  PNC at [facility] and will be delivering [facility] in [place]. Hx of C-section at 36 weeks for FITL. Patient desires TOLAC. Some prenatal [facility].   Presents with contractions, decreased fetal movement, leaking fluid   Patient's Medical History Anemia   Patient's Surgical History Previous C-Section  laparoscopy, wisdom teeth extraction, D&&C x 2 for SAB   Medications Pre Natal Vitamins   Allergies NKDA   Social History none   Family History Non-Contributory   ROS:  ROS see HPI   Exam:  Vital Signs stable  111/76   Urine Protein negative dipstick, UA negative except +2 ketones   General no apparent distress   Mental Status clear   Abdomen gravid, non-tender   Fetal Position cephalic on US   Edema no edema   Pelvic no external lesions, SSE: negative pooling, Nitrazine, fern. Wet prep:neg hyphae, Trich, clue cells. CX: closed/50%/-1   Mebranes Intact, AFI=9cm (discharge white mucoepithelial)   FHT normal rate with no decels, 125-130 baseline with accels to 150s   FHT Description mod variability   Fetal Heart Rate 130   Ucx occasional   Skin dry, hyperpigmentation around neck (tinea vs chlosma)   Impression:  Impression IUP at 35 weeks with reactive NST. No evidence of SROM.  Not in labor   Plan:  Plan Discharge home with labor precautions. [facility] for best chance of [facility] and VBAC.   Electronic Signatures: Trinna BalloonGutierrez, Airyana Sprunger L (CNM)   (Signed 04-Jun-14 16:38)  Authored: L&D Evaluation   Last Updated: 04-Jun-14 16:38 by Trinna BalloonGutierrez, Steve Gregg L (CNM)

## 2014-07-08 ENCOUNTER — Ambulatory Visit (INDEPENDENT_AMBULATORY_CARE_PROVIDER_SITE_OTHER): Admitting: Certified Nurse Midwife

## 2014-07-08 ENCOUNTER — Encounter: Payer: Self-pay | Admitting: Certified Nurse Midwife

## 2014-07-08 VITALS — BP 120/79 | HR 84 | Wt 141.0 lb

## 2014-07-08 DIAGNOSIS — Z01419 Encounter for gynecological examination (general) (routine) without abnormal findings: Secondary | ICD-10-CM

## 2014-07-08 DIAGNOSIS — Z113 Encounter for screening for infections with a predominantly sexual mode of transmission: Secondary | ICD-10-CM

## 2014-07-08 DIAGNOSIS — Z30431 Encounter for routine checking of intrauterine contraceptive device: Secondary | ICD-10-CM

## 2014-07-08 DIAGNOSIS — Z124 Encounter for screening for malignant neoplasm of cervix: Secondary | ICD-10-CM

## 2014-07-08 DIAGNOSIS — R5383 Other fatigue: Secondary | ICD-10-CM

## 2014-07-08 DIAGNOSIS — F419 Anxiety disorder, unspecified: Secondary | ICD-10-CM

## 2014-07-08 DIAGNOSIS — Z1151 Encounter for screening for human papillomavirus (HPV): Secondary | ICD-10-CM | POA: Diagnosis not present

## 2014-07-08 LAB — CBC WITH DIFFERENTIAL/PLATELET
Basophils Absolute: 0 10*3/uL (ref 0.0–0.1)
Basophils Relative: 0 % (ref 0–1)
Eosinophils Absolute: 0.1 10*3/uL (ref 0.0–0.7)
Eosinophils Relative: 1 % (ref 0–5)
HCT: 37 % (ref 36.0–46.0)
Hemoglobin: 12.2 g/dL (ref 12.0–15.0)
Lymphocytes Relative: 24 % (ref 12–46)
Lymphs Abs: 2.4 10*3/uL (ref 0.7–4.0)
MCH: 29.7 pg (ref 26.0–34.0)
MCHC: 33 g/dL (ref 30.0–36.0)
MCV: 90 fL (ref 78.0–100.0)
MPV: 9.5 fL (ref 8.6–12.4)
Monocytes Absolute: 1 10*3/uL (ref 0.1–1.0)
Monocytes Relative: 10 % (ref 3–12)
Neutro Abs: 6.4 10*3/uL (ref 1.7–7.7)
Neutrophils Relative %: 65 % (ref 43–77)
Platelets: 375 10*3/uL (ref 150–400)
RBC: 4.11 MIL/uL (ref 3.87–5.11)
RDW: 14.1 % (ref 11.5–15.5)
WBC: 9.8 10*3/uL (ref 4.0–10.5)

## 2014-07-08 NOTE — Patient Instructions (Signed)
Generalized Anxiety Disorder Generalized anxiety disorder (GAD) is a mental disorder. It interferes with life functions, including relationships, work, and school. GAD is different from normal anxiety, which everyone experiences at some point in their lives in response to specific life events and activities. Normal anxiety actually helps us prepare for and get through these life events and activities. Normal anxiety goes away after the event or activity is over.  GAD causes anxiety that is not necessarily related to specific events or activities. It also causes excess anxiety in proportion to specific events or activities. The anxiety associated with GAD is also difficult to control. GAD can vary from mild to severe. People with severe GAD can have intense waves of anxiety with physical symptoms (panic attacks).  SYMPTOMS The anxiety and worry associated with GAD are difficult to control. This anxiety and worry are related to many life events and activities and also occur more days than not for 6 months or longer. People with GAD also have three or more of the following symptoms (one or more in children):  Restlessness.   Fatigue.  Difficulty concentrating.   Irritability.  Muscle tension.  Difficulty sleeping or unsatisfying sleep. DIAGNOSIS GAD is diagnosed through an assessment by your health care provider. Your health care provider will ask you questions aboutyour mood,physical symptoms, and events in your life. Your health care provider may ask you about your medical history and use of alcohol or drugs, including prescription medicines. Your health care provider may also do a physical exam and blood tests. Certain medical conditions and the use of certain substances can cause symptoms similar to those associated with GAD. Your health care provider may refer you to a mental health specialist for further evaluation. TREATMENT The following therapies are usually used to treat GAD:    Medication. Antidepressant medication usually is prescribed for long-term daily control. Antianxiety medicines may be added in severe cases, especially when panic attacks occur.   Talk therapy (psychotherapy). Certain types of talk therapy can be helpful in treating GAD by providing support, education, and guidance. A form of talk therapy called cognitive behavioral therapy can teach you healthy ways to think about and react to daily life events and activities.  Stress managementtechniques. These include yoga, meditation, and exercise and can be very helpful when they are practiced regularly. A mental health specialist can help determine which treatment is best for you. Some people see improvement with one therapy. However, other people require a combination of therapies. Document Released: 05/27/2012 Document Revised: 06/16/2013 Document Reviewed: 05/27/2012 ExitCare Patient Information 2015 [employer]. This information is not intended to replace advice given to you by your health care provider. Make sure you discuss any questions you have with your health care provider.  

## 2014-07-08 NOTE — Progress Notes (Signed)
Patient ID: Katie Rose Cena, female   DOB: 1990-05-21, 24 y.o.   MRN: [id number]  Chief Complaint  Patient presents with  . Gynecologic Exam  . Anxiety    HPI Katie Rose Bedoy is a 24 y.o. female. [id number] who is here for her annual exam. Gynecologic Exam The patient's pertinent negatives include no genital itching, genital lesions, genital odor, genital rash, missed menses, pelvic pain, vaginal bleeding or vaginal discharge. The patient is experiencing no pain. She is not pregnant. Pertinent negatives include no dysuria or fever. Associated symptoms comments: Anxiety; feeling tired.    Past Medical History  Diagnosis Date  . No pertinent past medical history   . Ovarian cyst   . SAB (spontaneous abortion)     x2 - SAB x 1, and scar tissue removed from c/s incision site  . Preterm labor   . H/O oligohydramnios in prior pregnancy, currently pregnant   . Headache(784.0)   . Gastroesophageal reflux in pregancy     diet controlled and tums  . Anemia     history    Past Surgical History  Procedure Laterality Date  . Wisdom tooth extraction      x4  . Dilation and curettage of uterus      x 2  . Cesarean section  03/2004  . Laparoscopy      scar tissue from c/s incision site  . Cesarean section N/A 08/14/2012    Procedure: REPEAT CESAREAN SECTION;  Surgeon: Catalina AntiguaPeggy Constant, MD;  Location: [facility];  Service: Obstetrics;  Laterality: N/A;    Family History  Problem Relation Age of Onset  . Other Neg Hx   . Alcohol abuse Father   . Cancer Maternal Grandmother     cervical  . Hypertension Mother     Social History History  Substance Use Topics  . Smoking status: Never Smoker   . Smokeless tobacco: Never Used  . Alcohol Use: No    No Known Allergies  Current Outpatient Prescriptions  Medication Sig Dispense Refill  . topiramate (TOPAMAX) 50 MG tablet   5   No current facility-administered medications for this visit.    Review of Systems Review of Systems   Constitutional: Negative for fever, activity change and appetite change.  HENT: Negative for congestion.   Respiratory: Negative for shortness of breath.   Cardiovascular: Negative for chest pain.  Gastrointestinal: Negative for abdominal distention.  Endocrine: Positive for cold intolerance.  Genitourinary: Negative for dysuria, vaginal discharge, difficulty urinating, genital sores, menstrual problem, pelvic pain, dyspareunia and missed menses.  Psychiatric/Behavioral:       Reports anxiety  All other systems reviewed and are negative.   Blood pressure 120/79, pulse 84, weight 141 lb (63.957 kg), last menstrual period 06/14/2014, not currently breastfeeding.  Physical Exam Physical Exam  Constitutional: She is oriented to person, place, and time. She appears well-developed and well-nourished. No distress.  HENT:  Head: Normocephalic and atraumatic.  Neck: Normal range of motion. No thyromegaly present.  Cardiovascular: Normal rate, regular rhythm and normal heart sounds.   Pulmonary/Chest: Effort normal and breath sounds normal. No respiratory distress. She has no wheezes. She has no rales. She exhibits no tenderness.  Genitourinary: Rectum normal. Rectal exam shows no external hemorrhoid. No breast swelling, tenderness, discharge or bleeding. Pelvic exam was performed with patient supine. There is no rash, tenderness, lesion or injury on the right labia. There is no rash, tenderness, lesion or injury on the left labia. Uterus is not deviated, not enlarged and not  tender. Cervix exhibits no motion tenderness, no discharge and no friability. Right adnexum displays no mass, no tenderness and no fullness. Left adnexum displays no mass and no tenderness. No erythema, tenderness or bleeding in the vagina. No foreign body around the vagina. No signs of injury around the vagina. No vaginal discharge found.  IUD strings visualized  Musculoskeletal: Normal range of motion.  Neurological: She is  alert and oriented to person, place, and time.  Skin: Skin is warm and dry.  Psychiatric: She has a normal mood and affect. Her behavior is normal. Judgment and thought content normal.  Nursing note and vitals reviewed.   Data Reviewed   Assessment    Well Woman Exam   Plan       Pap CBC Return to office prn and in one year for annual exam      Clemmons,Lori Grissett 07/08/2014, 3:41 PM

## 2014-07-09 NOTE — Addendum Note (Signed)
Addended by: Barbara CowerNOGUES, Shakia Sebastiano L on: 07/09/2014 09:12 AM   Modules accepted: Orders

## 2014-07-10 ENCOUNTER — Ambulatory Visit: Payer: Self-pay | Admitting: Obstetrics & Gynecology

## 2014-07-14 LAB — CYTOLOGY - PAP

## 2014-09-16 ENCOUNTER — Ambulatory Visit (INDEPENDENT_AMBULATORY_CARE_PROVIDER_SITE_OTHER): Admitting: Advanced Practice Midwife

## 2014-09-16 ENCOUNTER — Encounter: Payer: Self-pay | Admitting: Advanced Practice Midwife

## 2014-09-16 VITALS — BP 109/74 | HR 79 | Resp 18 | Ht 63.0 in | Wt 142.0 lb

## 2014-09-16 DIAGNOSIS — N911 Secondary amenorrhea: Secondary | ICD-10-CM

## 2014-09-16 DIAGNOSIS — N9489 Other specified conditions associated with female genital organs and menstrual cycle: Secondary | ICD-10-CM

## 2014-09-16 DIAGNOSIS — Z98891 History of uterine scar from previous surgery: Secondary | ICD-10-CM

## 2014-09-16 DIAGNOSIS — N949 Unspecified condition associated with female genital organs and menstrual cycle: Secondary | ICD-10-CM

## 2014-09-16 DIAGNOSIS — T8389XA Other specified complication of genitourinary prosthetic devices, implants and grafts, initial encounter: Secondary | ICD-10-CM

## 2014-09-16 NOTE — Progress Notes (Signed)
Had paragard inserted 2 years ago, had regular cycles since insertion, reports she is two weeks late and unable to feel IUD string.  Also c/o vaginal itching with no discharge and pain with intercourse.

## 2014-09-16 NOTE — Patient Instructions (Signed)
Secondary Amenorrhea  Secondary amenorrhea is the stopping of menstrual flow for 3-6 months in a female who has previously had periods. There are many possible causes. Most of these causes are not serious. Usually, treating the underlying problem causing the loss of menses will return your periods to normal. CAUSES  Some common and uncommon causes of not menstruating include:  Malnutrition.  Low blood sugar (hypoglycemia).  Polycystic ovary disease.  Stress or fear.  Breastfeeding.  Hormone imbalance.  Ovarian failure.  Medicines.  Extreme obesity.  Cystic fibrosis.  Low body weight or drastic weight reduction from any cause.  Early menopause.  Removal of ovaries or uterus.  Contraceptives.  Illness.  Long-term (chronic) illnesses.  Cushing syndrome.  Thyroid problems.  Birth control pills, patches, or vaginal rings for birth control. RISK FACTORS You may be at greater risk of secondary amenorrhea if:  You have a family history of this condition.  You have an eating disorder.  You do athletic training. DIAGNOSIS  A diagnosis is made by your health care provider taking a medical history and doing a physical exam. This will include a pelvic exam to check for problems with your reproductive organs. Pregnancy must be ruled out. Often, numerous blood tests are done to measure different hormones in the body. Urine testing may be done. Specialized exams (ultrasound, CT scan, MRI, or hysteroscopy) may have to be done as well as measuring the body mass index (BMI). TREATMENT  Treatment depends on the cause of the amenorrhea. If an eating disorder is present, this can be treated with an adequate diet and therapy. Chronic illnesses may improve with treatment of the illness. Amenorrhea may be corrected with medicines, lifestyle changes, or surgery. If the amenorrhea cannot be corrected, it is sometimes possible to create a false menstruation with medicines. HOME CARE  INSTRUCTIONS  Maintain a healthy diet.  Manage weight problems.  Exercise regularly but not excessively.  Get adequate sleep.  Manage stress.  Be aware of changes in your menstrual cycle. Keep a record of when your periods occur. Note the date your period starts, how long it lasts, and any problems. SEEK MEDICAL CARE IF: Your symptoms do not get better with treatment. Document Released: 03/13/2006 Document Revised: 10/02/2012 Document Reviewed: 07/18/2012 ExitCare Patient Information 2015 [employer]. This information is not intended to replace advice given to you by your health care provider. Make sure you discuss any questions you have with your health care provider.  

## 2014-09-16 NOTE — Progress Notes (Signed)
   Subjective:    Patient ID: Katie Rose, female    DOB: August 14, 1990, 24 y.o.   MRN: [id number]  HPI: Here for IUD check adn period 2 weeks late. Can't feel strings. Increased appetite. Concerned about possible pregnancy. Usually has 30 day cycles with bleeding lasting 5 days. Last menstrual period 08/03/2014, lasting 3 days, normal flow.   Review of Systems  Positive for increased appetite, vaginal irritation and burning. Negative for fever, chills, abdominal pain, vaginal discharge or vaginal bleeding.    Objective:   Physical Exam General appearance - alert, well appearing, and in no distress, normal appearing weight and anxious Abdomen - soft, nontender, nondistended, no masses or organomegaly Pelvic - normal external genitalia, vulva, vagina, cervix, uterus and adnexa, CERVIX: normal appearing cervix without lesions. IUD strings seen. Small amount of mucoid discharge at os, UTERUS: uterus is normal size, shape, consistency and non-tender.  UPT neg    Assessment & Plan:  1. Vaginal burning  - Wet prep, genital  2. IUD complication, initial encounter UPT  3. Secondary amenorrhea UPT  Discussed possible causes for secondary amenorrhea. Extensive workup not indicated at this time, but patient instructed to repeat UPT and schedule follow-up appointment if her menstrual period does not resume in the next month.  [place]an, [place] 09/16/2014 3:49 PM

## 2014-09-17 LAB — WET PREP, GENITAL: TRICH WET PREP: NONE SEEN

## 2014-09-19 ENCOUNTER — Other Ambulatory Visit: Payer: Self-pay | Admitting: Advanced Practice Midwife

## 2014-09-19 DIAGNOSIS — B373 Candidiasis of vulva and vagina: Secondary | ICD-10-CM

## 2014-09-19 DIAGNOSIS — B3731 Acute candidiasis of vulva and vagina: Secondary | ICD-10-CM

## 2014-09-19 MED ORDER — FLUCONAZOLE 150 MG PO TABS: 150.0000 mg | ORAL_TABLET | Freq: Once | ORAL | Status: DC

## 2014-09-19 NOTE — Progress Notes (Signed)
Wet prep pos yeast. Rx Diflucan.

## 2014-09-21 ENCOUNTER — Telehealth: Payer: Self-pay | Admitting: *Deleted

## 2014-09-21 NOTE — Telephone Encounter (Signed)
Informed pt of wet prep results and that rx for Diflucan had been sent to the pharmacy.  Informed pt of medication use and preventative measures, pt acknowledged instructions.

## 2014-12-08 ENCOUNTER — Emergency Department
Admission: EM | Admit: 2014-12-08 | Discharge: 2014-12-08 | Disposition: A | Discharge status: Home / Self Care | Attending: Emergency Medicine | Admitting: Emergency Medicine

## 2014-12-08 ENCOUNTER — Encounter: Payer: Self-pay | Admitting: Emergency Medicine

## 2014-12-08 DIAGNOSIS — Z79899 Other long term (current) drug therapy: Secondary | ICD-10-CM | POA: Diagnosis not present

## 2014-12-08 DIAGNOSIS — Z3202 Encounter for pregnancy test, result negative: Secondary | ICD-10-CM | POA: Diagnosis not present

## 2014-12-08 DIAGNOSIS — G43909 Migraine, unspecified, not intractable, without status migrainosus: Secondary | ICD-10-CM

## 2014-12-08 DIAGNOSIS — G43819 Other migraine, intractable, without status migrainosus: Secondary | ICD-10-CM | POA: Diagnosis not present

## 2014-12-08 LAB — POCT PREGNANCY, URINE: PREG TEST UR: NEGATIVE

## 2014-12-08 LAB — CBC
HCT: 39.9 % (ref 35.0–47.0)
HEMOGLOBIN: 13 g/dL (ref 12.0–16.0)
MCH: 28.6 pg (ref 26.0–34.0)
MCHC: 32.5 g/dL (ref 32.0–36.0)
MCV: 87.9 fL (ref 80.0–100.0)
Platelets: 346 10*3/uL (ref 150–440)
RBC: 4.54 MIL/uL (ref 3.80–5.20)
RDW: 14.7 % — ABNORMAL HIGH (ref 11.5–14.5)
WBC: 8.4 10*3/uL (ref 3.6–11.0)

## 2014-12-08 LAB — BASIC METABOLIC PANEL
ANION GAP: 7 (ref 5–15)
BUN: 15 mg/dL (ref 6–20)
CALCIUM: 9.1 mg/dL (ref 8.9–10.3)
CHLORIDE: 105 mmol/L (ref 101–111)
CO2: 26 mmol/L (ref 22–32)
Creatinine, Ser: 0.65 mg/dL (ref 0.44–1.00)
GFR calc non Af Amer: >60 mL/min (ref >60)
Glucose, Bld: 92 mg/dL (ref 65–99)
Potassium: 3.9 mmol/L (ref 3.5–5.1)
Sodium: 138 mmol/L (ref 135–145)

## 2014-12-08 LAB — GLUCOSE, CAPILLARY: GLUCOSE-CAPILLARY: 90 mg/dL (ref 65–99)

## 2014-12-08 MED ORDER — BUTALBITAL-APAP-CAFFEINE 50-325-40 MG PO TABS: 1.0000 | ORAL_TABLET | Freq: Four times a day (QID) | ORAL | Status: AC | PRN

## 2014-12-08 MED ORDER — DIPHENHYDRAMINE HCL 50 MG/ML IJ SOLN
50.0000 mg | Freq: Once | INTRAMUSCULAR | Status: AC
  Administered 2014-12-08: 50 mg via INTRAVENOUS
  Filled 2014-12-08: qty 1

## 2014-12-08 MED ORDER — SODIUM CHLORIDE 0.9 % IV BOLUS (SEPSIS)
1000.0000 mL | Freq: Once | INTRAVENOUS | Status: AC
  Administered 2014-12-08: 1000 mL via INTRAVENOUS

## 2014-12-08 MED ORDER — KETOROLAC TROMETHAMINE 30 MG/ML IJ SOLN
30.0000 mg | Freq: Once | INTRAMUSCULAR | Status: AC
  Administered 2014-12-08: 30 mg via INTRAVENOUS
  Filled 2014-12-08: qty 1

## 2014-12-08 MED ORDER — METOCLOPRAMIDE HCL 5 MG/ML IJ SOLN
10.0000 mg | Freq: Once | INTRAMUSCULAR | Status: AC
  Administered 2014-12-08: 10 mg via INTRAVENOUS
  Filled 2014-12-08: qty 2

## 2014-12-08 NOTE — ED Provider Notes (Signed)
[facility] Emergency Department Provider Note  Time seen: 6:57 PM  I have reviewed the triage vital signs and the nursing notes.   HISTORY  Chief Complaint Migraine    HPI Katie Rose is a 24 y.o. female with a past medical history of migraine headaches, anemia, presents the emergency department with a headache. According to the patient she has a long history of migraine headaches. He is prescribed several medications to take at home for her migraines. States this headache has been ongoing for the past 2 days. She has been getting blurred vision in her right eye which is lasting for 5 minutes at a time. She states blurred vision is typical during her migraines but states they usually last 20 or 30 seconds, now 5 minutes. Currently denies any visual disturbances. Does state the headache is mostly right-sided behind her right eye. States the headache is somewhat more severe than normal, and is associated with nausea which is somewhat atypical. She states it was a very progressive onset. Denies fever or chills. Denies neck pain. Denies focal weakness or numbness.Currently describes the headache as moderate to severe.    Past Medical History  Diagnosis Date  . No pertinent past medical history   . Ovarian cyst   . SAB (spontaneous abortion)     x2 - SAB x 1, and scar tissue removed from c/s incision site  . Preterm labor   . H/O oligohydramnios in prior pregnancy, currently pregnant   . Headache(784.0)   . Gastroesophageal reflux in pregancy     diet controlled and tums  . Anemia     history    Patient Active Problem List   Diagnosis Date Noted  . Migraine with aura 08/06/2012  . 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 Rapid palpitations 06/05/2012  . History of cesarean delivery 03/05/2012  . Dysuria 03/05/2012    Past Surgical History  Procedure Laterality Date  . Wisdom tooth extraction      x4  . Dilation and curettage of uterus      x 2  .  Cesarean section  03/2004  . Laparoscopy      scar tissue from c/s incision site  . Cesarean section N/A 08/14/2012    Procedure: REPEAT CESAREAN SECTION;  Surgeon: Catalina Antigua, MD;  Location: [facility];  Service: Obstetrics;  Laterality: N/A;    Current Outpatient Rx  Name  Route  Sig  Dispense  Refill  . fluconazole (DIFLUCAN) 150 MG tablet   Oral   Take 1 tablet (150 mg total) by mouth once. Can take additional dose three days later if symptoms persist   2 tablet   0   . PARAGARD INTRAUTERINE COPPER IU   Intrauterine   by Intrauterine route.         . topiramate (TOPAMAX) 50 MG tablet            5     Allergies Review of patient's allergies indicates no known allergies.  Family History  Problem Relation Age of Onset  . Other Neg Hx   . Alcohol abuse Father   . Cancer Maternal Grandmother     cervical  . Hypertension Mother     Social History Social History  Substance Use Topics  . Smoking status: Never Smoker   . Smokeless tobacco: Never Used  . Alcohol Use: No    Review of Systems Constitutional: Negative for fever. Cardiovascular: Negative for chest pain. Respiratory: Negative for shortness of breath. Gastrointestinal: Negative for  abdominal pain Musculoskeletal: Negative for back pain. Neurological: Moderate headache. Denies focal weakness or numbness. Does state right eye blurred vision at times. Denies any currently. 10-point ROS otherwise negative.  ____________________________________________   PHYSICAL EXAM:  VITAL SIGNS: ED Triage Vitals  Enc Vitals Group     BP 12/08/14 1805 129/94 mmHg     Pulse Rate 12/08/14 1805 99     Resp 12/08/14 1805 16     Temp 12/08/14 1805 97.9 F (36.6 C)     Temp Source 12/08/14 1805 Oral     SpO2 12/08/14 1805 100 %     Weight 12/08/14 1805 141 lb (63.957 kg)     Height 12/08/14 1805 5\' 3"  (1.6 m)     Head Cir --      Peak Flow --      Pain Score 12/08/14 1806 7     Pain Loc --      Pain Edu? --       Excl. in GC? --     Constitutional: Alert and oriented. Well appearing and in no distress. Eyes: Normal exam, 2 mm PERRL, mild photophobia. ENT   Head: Normocephalic and atraumatic.   Mouth/Throat: Mucous membranes are moist. Cardiovascular: Normal rate, regular rhythm. No murmur Respiratory: Normal respiratory effort without tachypnea nor retractions. Breath sounds are clear  Gastrointestinal: Soft and nontender. No distention.   Musculoskeletal: Nontender with normal range of motion in all extremities.  Neurologic:  Normal speech and language. No gross focal neurologic deficits are appreciated. Speech is normal. No pronator drift. Equal grip strengths. No facial droop. Cranial nerves intact. Skin:  Skin is warm, dry and intact.  Psychiatric: Mood and affect are normal. Speech and behavior are normal. Patient exhibits appropriate insight and judgment.  ____________________________________________    INITIAL IMPRESSION / ASSESSMENT AND PLAN / ED COURSE  Pertinent labs & imaging results that were available during my care of the patient were reviewed by me and considered in my medical decision making (see chart for details).  Patient presents with signs and symptoms of a complex migraine. I discussed CT imaging of the brain, the patient states she had an MRI 2 years ago, and one rather hold off on any radiation. Patient has a great neurologic exam currently, I'm not concerned for CVA. Patient states the right eye blurred vision is typical of her migraines, the atypical symptom is the duration of the blurred vision, she states typically it is less than 1 minute, and this time it is approximately 5 minutes at a time. Denies any blurred vision currently. We will check labs, treat her migraine headache, and closely monitor in the emergency department. Patient is agreeable to plan. She sees Dr.Shah (neurology) for her migraine headaches.  Patient states her headache is much improved after  medications. Labs are largely within normal limits. Denies any further episodes of blurry vision since arriving to the emergency department. We will discharge home with neurology follow-up. We'll prescribe Fioricet for any further headache pain, patient is agreeable to plan. ____________________________________________   FINAL CLINICAL IMPRESSION(S) / ED DIAGNOSES  Complex migraine   Minna AntisKevin Banesa Tristan, MD 12/08/14 2033

## 2014-12-08 NOTE — Discharge Instructions (Signed)

## 2014-12-08 NOTE — ED Notes (Signed)
Pt C/O headache x2 days with sharp pains over right eye. Pt reports vision loss in the right eye for around 5 minutes today along with episodes of N/V. Pt has a hx of migraines but describes this pain worse.

## 2014-12-14 ENCOUNTER — Other Ambulatory Visit: Payer: Self-pay | Admitting: Neurology

## 2014-12-14 DIAGNOSIS — R42 Dizziness and giddiness: Secondary | ICD-10-CM

## 2014-12-14 DIAGNOSIS — H547 Unspecified visual loss: Secondary | ICD-10-CM

## 2014-12-14 DIAGNOSIS — H538 Other visual disturbances: Secondary | ICD-10-CM

## 2014-12-17 ENCOUNTER — Ambulatory Visit
Admission: RE | Admit: 2014-12-17 | Discharge: 2014-12-17 | Disposition: A | Discharge status: Home / Self Care | Source: Ambulatory Visit | Attending: Neurology | Admitting: Neurology

## 2014-12-17 DIAGNOSIS — H547 Unspecified visual loss: Secondary | ICD-10-CM

## 2014-12-17 DIAGNOSIS — R42 Dizziness and giddiness: Secondary | ICD-10-CM

## 2014-12-17 DIAGNOSIS — H538 Other visual disturbances: Secondary | ICD-10-CM

## 2014-12-17 MED ORDER — GADOBENATE DIMEGLUMINE 529 MG/ML IV SOLN
13.0000 mL | Freq: Once | INTRAVENOUS | Status: AC | PRN
  Administered 2014-12-17: 13 mL via INTRAVENOUS

## 2014-12-28 ENCOUNTER — Other Ambulatory Visit

## 2015-09-04 ENCOUNTER — Emergency Department
Admission: EM | Admit: 2015-09-04 | Discharge: 2015-09-04 | Disposition: A | Discharge status: Home / Self Care | Attending: Emergency Medicine | Admitting: Emergency Medicine

## 2015-09-04 ENCOUNTER — Encounter: Payer: Self-pay | Admitting: Emergency Medicine

## 2015-09-04 DIAGNOSIS — T63444A Toxic effect of venom of bees, undetermined, initial encounter: Secondary | ICD-10-CM | POA: Insufficient documentation

## 2015-09-04 DIAGNOSIS — T63464A Toxic effect of venom of wasps, undetermined, initial encounter: Secondary | ICD-10-CM

## 2015-09-04 DIAGNOSIS — Z79899 Other long term (current) drug therapy: Secondary | ICD-10-CM | POA: Diagnosis not present

## 2015-09-04 LAB — POCT PREGNANCY, URINE: PREG TEST UR: NEGATIVE

## 2015-09-04 MED ORDER — ONDANSETRON 4 MG PO TBDP
4.0000 mg | ORAL_TABLET | Freq: Once | ORAL | Status: AC
  Administered 2015-09-04: 4 mg via ORAL
  Filled 2015-09-04: qty 1

## 2015-09-04 MED ORDER — EPINEPHRINE 0.3 MG/0.3ML IJ SOAJ: 0.3000 mg | Freq: Once | INTRAMUSCULAR | Status: AC

## 2015-09-04 MED ORDER — PROMETHAZINE HCL 12.5 MG PO TABS: 12.5000 mg | ORAL_TABLET | Freq: Four times a day (QID) | ORAL | Status: DC | PRN

## 2015-09-04 MED ORDER — PROMETHAZINE HCL 25 MG PO TABS
25.0000 mg | ORAL_TABLET | Freq: Once | ORAL | Status: AC
  Administered 2015-09-04: 25 mg via ORAL
  Filled 2015-09-04: qty 1

## 2015-09-04 NOTE — ED Provider Notes (Signed)
[facility] Emergency Department Provider Note    ____________________________________________  Time seen: ~2135  I have reviewed the triage vital signs and the nursing notes.   HISTORY  Chief Complaint Dizziness; Insect Bite; Emesis; and Allergic Reaction   History limited by: Not Limited   HPI Katie Rose is a 25 y.o. female who presents to the emergency department today after being stung by wasp. This happened roughly 3 hours ago. She was at a park. She did not develop any rash or itchiness. She did however develop some nausea. This nausea has continued. She also feels slightly dizzy. She denies ever being stung by bee or wasp or bitten by an in the past. She denies any allergies. Denies any shortness breath or feeling like her throat is closing.   Past Medical History  Diagnosis Date  . No pertinent past medical history   . Ovarian cyst   . SAB (spontaneous abortion)     x2 - SAB x 1, and scar tissue removed from c/s incision site  . Preterm labor   . H/O oligohydramnios in prior pregnancy, currently pregnant   . Headache(784.0)   . Gastroesophageal reflux in pregancy     diet controlled and tums  . Anemia     history    Patient Active Problem List   Diagnosis Date Noted  . Migraine with aura 08/06/2012  . 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 Rapid palpitations 06/05/2012  . History of cesarean delivery 03/05/2012  . Dysuria 03/05/2012    Past Surgical History  Procedure Laterality Date  . Wisdom tooth extraction      x4  . Dilation and curettage of uterus      x 2  . Cesarean section  03/2004  . Laparoscopy      scar tissue from c/s incision site  . Cesarean section N/A 08/14/2012    Procedure: REPEAT CESAREAN SECTION;  Surgeon: Catalina Antigua, MD;  Location: [facility] ORS;  Service: Obstetrics;  Laterality: N/A;    Current Outpatient Rx  Name  Route  Sig  Dispense  Refill  . butalbital-acetaminophen-caffeine (FIORICET) 50-325-40  MG tablet   Oral   Take 1-2 tablets by mouth every 6 (six) hours as needed for headache.   20 tablet   0   . fluconazole (DIFLUCAN) 150 MG tablet   Oral   Take 1 tablet (150 mg total) by mouth once. Can take additional dose three days later if symptoms persist   2 tablet   0   . PARAGARD INTRAUTERINE COPPER IU   Intrauterine   by Intrauterine route.         . topiramate (TOPAMAX) 50 MG tablet            5     Allergies Review of patient's allergies indicates no known allergies.  Family History  Problem Relation Age of Onset  . Other Neg Hx   . Alcohol abuse Father   . Cancer Maternal Grandmother     cervical  . Hypertension Mother     Social History Social History  Substance Use Topics  . Smoking status: Never Smoker   . Smokeless tobacco: Never Used  . Alcohol Use: No    Review of Systems  Constitutional: Negative for fever. Cardiovascular: Negative for chest pain. Respiratory: Negative for shortness of breath. Gastrointestinal: Negative for abdominal pain, vomiting and diarrhea.Positive for nausea Neurological: Negative for headaches, focal weakness or numbness.  10-point ROS otherwise negative.  ____________________________________________   PHYSICAL EXAM:  VITAL SIGNS: ED Triage Vitals  Enc Vitals Group     BP 09/04/15 1930 105/76 mmHg     Pulse Rate 09/04/15 1930 110     Resp 09/04/15 1930 18     Temp 09/04/15 1930 98.2 F (36.8 C)     Temp Source 09/04/15 1930 Oral     SpO2 09/04/15 1930 100 %     Weight 09/04/15 1930 140 lb (63.504 kg)     Height 09/04/15 1930 5\' 3"  (1.6 m)   Constitutional: Alert and oriented. Well appearing and in no distress. Eyes: Conjunctivae are normal. PERRL. Normal extraocular movements. ENT   Head: Normocephalic and atraumatic.   Nose: No congestion/rhinnorhea.   Mouth/Throat: Mucous membranes are moist.   Neck: No stridor. Hematological/Lymphatic/Immunilogical: No cervical  lymphadenopathy. Cardiovascular: Normal rate, regular rhythm.  No murmurs, rubs, or gallops. Respiratory: Normal respiratory effort without tachypnea nor retractions. Breath sounds are clear and equal bilaterally. No wheezes/rales/rhonchi. Gastrointestinal: Soft and nontender. No distention. Genitourinary: Deferred Musculoskeletal: Normal range of motion in all extremities. No joint effusions.  Neurologic:  Normal speech and language. No gross focal neurologic deficits are appreciated.  Skin:  Skin is warm, dry and intact. Small lesion on right posterior shoulder consistent with insect sting. No erythema or warmth to the area. No stinger present. Psychiatric: Mood and affect are normal. Speech and behavior are normal. Patient exhibits appropriate insight and judgment.  ____________________________________________    LABS (pertinent positives/negatives)  None  ____________________________________________   EKG  I, Phineas Semen, attending physician, personally viewed and interpreted this EKG  EKG Time: 1934 Rate: 97 Rhythm: normal sinus rhythm Axis: left axis deviation Intervals: qtc 444 QRS: narrow ST changes: no st elevation Impression: abnormal ekg   ____________________________________________    RADIOLOGY  None  ____________________________________________   PROCEDURES  Procedure(s) performed: None  Critical Care performed: No  ____________________________________________   INITIAL IMPRESSION / ASSESSMENT AND PLAN / ED COURSE  Pertinent labs & imaging results that were available during my care of the patient were reviewed by me and considered in my medical decision making (see chart for details).  She presented to the emergency department today after a wasp sting. Patient was complaining of some nausea and vomiting. No other concerning signs of anaphylaxis, no hypotension, no chest pain, no shortness breath, no rash. Patient at this point is safe for  discharge home. We will however give EpiPen for any potential future severe allergic reactions.  ____________________________________________   FINAL CLINICAL IMPRESSION(S) / ED DIAGNOSES  Final diagnoses:  Wasp sting, undetermined intent, initial encounter     Note: This dictation was prepared with Dragon dictation. Any transcriptional errors that result from this process are unintentional    Phineas Semen, MD 09/04/15 2311

## 2015-09-04 NOTE — ED Notes (Signed)
Pt states stung by a wasp and now has nausea "like morning sickness that won't go away".

## 2015-09-04 NOTE — ED Notes (Signed)
Patient states that she was stung about 45 minutes ago on her right arm. Patient reports that about 15-20 mins after the sting she developed nausea, vomiting and dizziness.

## 2015-09-04 NOTE — Discharge Instructions (Signed)
Please seek medical attention for any high fevers, chest pain, shortness of breath, change in behavior, persistent vomiting, bloody stool or any other new or concerning symptoms.   Bee, Wasp, or [employer], wasps, and hornets are part of a family of insects that can sting people. These stings can cause pain and inflammation, but they are usually not serious. However, some people may have an allergic reaction to a sting. This can cause the symptoms to be more severe.  SYMPTOMS  Common symptoms of this condition include:   A red lump in the skin that sometimes has a tiny hole in the center. In some cases, a stinger may be in the center of the wound.  Pain and itching at the sting site.  Redness and swelling around the sting site. If you have an allergic reaction (localized allergic reaction), the swelling and redness may spread out from the sting site. In some cases, this reaction can continue to develop over the next 12-36 hours. In rare cases, a person may have a severe allergic reaction (anaphylactic reaction) to a sting. Symptoms of an anaphylactic reaction may include:   Wheezing or difficulty breathing.  Raised, itchy, red patches on the skin.  Nausea or vomiting.  Abdominal cramping.  Diarrhea.  Chest pain.  Fainting.  Redness of the face (flushing). DIAGNOSIS  This condition is usually diagnosed based on symptoms, medical history, and a physical exam. TREATMENT  Most stings can be treated with:   Icing to reduce swelling.  Medicines (antihistamines) to treat itching or an allergic reaction.  Medicines to help reduce pain. These may be medicines that you take by mouth, or medicated creams or lotions that you apply to your skin. If you were stung by a bee, the stinger and a small sac of poison may be in the wound. This may be removed by brushing across it with a flat card, such as a credit card. Another method is to pinch the area and pull it out. These methods can  help reduce the severity of the body's reaction to the sting.  HOME CARE INSTRUCTIONS   Wash the sting site daily with soap and water as told by your health care provider.  Apply or take over-the-counter and prescription medicines only as told by your health care provider.  If directed, apply ice to the sting area.  Put ice in a plastic bag.  Place a towel between your skin and the bag.  Leave the ice on for 20 minutes, 2-3 times per day.  Do not scratch the sting area.  To lessen pain, try using a paste that is made of water and baking soda. Rub the paste on the sting area and leave it on for 5 minutes.  If you had a severe allergic reaction to a sting, you may need:  To wear a medical bracelet or necklace that lists the allergy.  To learn when and how to use an anaphylaxis kit or epinephrine injection. Your family members may also need to learn this.  To carry an anaphylaxis kit with you at all times. SEEK MEDICAL CARE IF:   Your symptoms do not get better in 2-3 days.  You have redness, swelling, or pain that spreads beyond the area of the sting.  You have a fever. SEEK IMMEDIATE MEDICAL CARE IF:  You have symptoms of a severe allergic reaction. These include:   Wheezing or difficulty breathing.  Chest pain.  Light-headedness or fainting.  Itchy, raised, red patches on  the skin.  Nausea or vomiting.  Abdominal cramping.  Diarrhea.   This information is not intended to replace advice given to you by your health care provider. Make sure you discuss any questions you have with your health care provider.   Document Released: 01/30/2005 Document Revised: 10/21/2014 Document Reviewed: 06/17/2014 Elsevier Interactive Patient Education [employer].

## 2015-09-05 ENCOUNTER — Emergency Department: Admission: EM | Admit: 2015-09-05 | Discharge: 2015-09-05 | Disposition: A | Discharge status: Home / Self Care

## 2016-11-03 ENCOUNTER — Emergency Department (HOSPITAL_COMMUNITY)

## 2016-11-03 ENCOUNTER — Emergency Department (HOSPITAL_COMMUNITY)
Admission: EM | Admit: 2016-11-03 | Discharge: 2016-11-03 | Disposition: A | Discharge status: Home / Self Care | Attending: Emergency Medicine | Admitting: Emergency Medicine

## 2016-11-03 ENCOUNTER — Encounter (HOSPITAL_COMMUNITY): Payer: Self-pay | Admitting: Neurology

## 2016-11-03 DIAGNOSIS — Z79899 Other long term (current) drug therapy: Secondary | ICD-10-CM | POA: Diagnosis not present

## 2016-11-03 DIAGNOSIS — K921 Melena: Secondary | ICD-10-CM

## 2016-11-03 DIAGNOSIS — K644 Residual hemorrhoidal skin tags: Secondary | ICD-10-CM

## 2016-11-03 DIAGNOSIS — K649 Unspecified hemorrhoids: Secondary | ICD-10-CM | POA: Insufficient documentation

## 2016-11-03 DIAGNOSIS — K625 Hemorrhage of anus and rectum: Secondary | ICD-10-CM | POA: Diagnosis present

## 2016-11-03 LAB — URINALYSIS, ROUTINE W REFLEX MICROSCOPIC
BILIRUBIN URINE: NEGATIVE
Bacteria, UA: NONE SEEN
GLUCOSE, UA: NEGATIVE mg/dL
Ketones, ur: NEGATIVE mg/dL
NITRITE: NEGATIVE
PH: 6 (ref 5.0–8.0)
Protein, ur: NEGATIVE mg/dL
SPECIFIC GRAVITY, URINE: 1.012 (ref 1.005–1.030)

## 2016-11-03 LAB — TYPE AND SCREEN
ABO/RH(D): O POS
ANTIBODY SCREEN: NEGATIVE

## 2016-11-03 LAB — CBC
HEMATOCRIT: 37.4 % (ref 36.0–46.0)
Hemoglobin: 12.3 g/dL (ref 12.0–15.0)
MCH: 28.3 pg (ref 26.0–34.0)
MCHC: 32.9 g/dL (ref 30.0–36.0)
MCV: 86.2 fL (ref 78.0–100.0)
Platelets: 387 10*3/uL (ref 150–400)
RBC: 4.34 MIL/uL (ref 3.87–5.11)
RDW: 15.2 % (ref 11.5–15.5)
WBC: 9.7 10*3/uL (ref 4.0–10.5)

## 2016-11-03 LAB — COMPREHENSIVE METABOLIC PANEL
ALK PHOS: 59 U/L (ref 38–126)
ALT: 12 U/L — ABNORMAL LOW (ref 14–54)
AST: 19 U/L (ref 15–41)
Albumin: 3.8 g/dL (ref 3.5–5.0)
Anion gap: 7 (ref 5–15)
BUN: 12 mg/dL (ref 6–20)
CALCIUM: 9 mg/dL (ref 8.9–10.3)
CHLORIDE: 106 mmol/L (ref 101–111)
CO2: 23 mmol/L (ref 22–32)
Creatinine, Ser: 0.69 mg/dL (ref 0.44–1.00)
GFR calc Af Amer: >60 mL/min (ref >60)
GFR calc non Af Amer: >60 mL/min (ref >60)
Glucose, Bld: 86 mg/dL (ref 65–99)
Potassium: 3.6 mmol/L (ref 3.5–5.1)
SODIUM: 136 mmol/L (ref 135–145)
Total Bilirubin: 0.5 mg/dL (ref 0.3–1.2)
Total Protein: 7.5 g/dL (ref 6.5–8.1)

## 2016-11-03 LAB — I-STAT BETA HCG BLOOD, ED (MC, WL, AP ONLY): I-stat hCG, quantitative: <5 m[IU]/mL (ref <5)

## 2016-11-03 LAB — ABO/RH: ABO/RH(D): O POS

## 2016-11-03 MED ORDER — IBUPROFEN 400 MG PO TABS
600.0000 mg | ORAL_TABLET | Freq: Once | ORAL | Status: AC
  Administered 2016-11-03: 22:00:00 600 mg via ORAL
  Filled 2016-11-03: qty 1

## 2016-11-03 NOTE — ED Provider Notes (Signed)
[facility]-EMERGENCY DEPT Provider Note   CSN: [id number] Arrival date & time: 11/03/16  1715     History   Chief Complaint Chief Complaint  Patient presents with  . Rectal Bleeding  . Palpitations    HPI Katie Rose is a 26 y.o. female.  The history is provided by the patient. No language interpreter was used.  Rectal Bleeding  Palpitations      Katie Rose is a 26 y.o. female who presents to the Emergency Department complaining of rectal bleeding.  Yesterday she developed hematochezia. She did just prior to that have some left lower quadrant abdominal pain. Pain is stabbing and intermittent in nature. It radiates down her left thigh. She has bright red blood in the toilet when she goes to urinate and there are occasional small clots when she goes to light. She is certain that this is not vaginal or urinary bleeding. She denies any fevers, chest pain, shortness of breath. She has had problems in the past with rectal bleeding but none in the last year. No dysuria, hematuria, vaginal discharge. She does experience constipation.    Past Medical History:  Diagnosis Date  . Anemia    history  . Gastroesophageal reflux in pregancy    diet controlled and tums  . H/O oligohydramnios in prior pregnancy, currently pregnant   . Headache(784.0)   . No pertinent past medical history   . Ovarian cyst   . Preterm labor   . SAB (spontaneous abortion)    x2 - SAB x 1, and scar tissue removed from c/s incision site    Patient Active Problem List   Diagnosis Date Noted  . Migraine with aura 08/06/2012  . 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 Rapid palpitations 06/05/2012  . History of cesarean delivery 03/05/2012  . Dysuria 03/05/2012    Past Surgical History:  Procedure Laterality Date  . CESAREAN SECTION  03/2004  . CESAREAN SECTION N/A 08/14/2012   Procedure: REPEAT CESAREAN SECTION;  Surgeon: Catalina Antigua, MD;  Location: [facility];  Service: Obstetrics;  Laterality: N/A;  .  DILATION AND CURETTAGE OF UTERUS     x 2  . LAPAROSCOPY     scar tissue from c/s incision site  . WISDOM TOOTH EXTRACTION     x4    OB History    Gravida Para Term Preterm AB Living   SAB TAB Ectopic Multiple Live Births   2       2       Home Medications    Prior to Admission medications   Medication Sig Start Date End Date Taking? Authorizing Provider  topiramate (TOPAMAX) 100 MG tablet Take 100 mg by mouth daily.  05/10/14  Yes [provider]  EPINEPHrine (EPIPEN 2-PAK) 0.3 mg/0.3 mL IJ SOAJ injection Inject 0.3 mLs (0.3 mg total) into the muscle once. Patient not taking: Reported on 11/03/2016 09/04/15   Phineas Semen, MD  fluconazole (DIFLUCAN) 150 MG tablet Take 1 tablet (150 mg total) by mouth once. Can take additional dose three days later if symptoms persist Patient not taking: Reported on 11/03/2016 09/19/14   Katrinka Blazing, [place], CNM  promethazine (PHENERGAN) 12.5 MG tablet Take 1 tablet (12.5 mg total) by mouth every 6 (six) hours as needed for nausea or vomiting. Patient not taking: Reported on 11/03/2016 09/04/15   Phineas Semen, MD    Family History Family History  Problem Relation Age of Onset  . Alcohol abuse Father   .  Cancer Maternal Grandmother        cervical  . Hypertension Mother   . Other Neg Hx     Social History Social History  Substance Use Topics  . Smoking status: Never Smoker  . Smokeless tobacco: Never Used  . Alcohol use No     Allergies   Patient has no known allergies.   Review of Systems Review of Systems  Cardiovascular: Positive for palpitations.  Gastrointestinal: Positive for hematochezia.  All other systems reviewed and are negative.    Physical Exam Updated Vital Signs BP 99/63 (BP Location: Right Arm)   Pulse 71   Temp 98.4 F (36.9 C) (Oral)   Resp 18   LMP 10/08/2016   SpO2 100%   Physical Exam  Constitutional: She is oriented to person, place, and time. She appears well-developed and  well-nourished.  HENT:  Head: Normocephalic and atraumatic.  Cardiovascular: Normal rate and regular rhythm.   No murmur heard. Pulmonary/Chest: Effort normal and breath sounds normal. No respiratory distress.  Abdominal: Soft. There is no tenderness. There is no rebound and no guarding.  Genitourinary:  Genitourinary Comments: Small external hemorrhoid with small amount of dried blood. No active bleeding.  Musculoskeletal: She exhibits no edema or tenderness.  Neurological: She is alert and oriented to person, place, and time.  Skin: Skin is warm and dry.  Psychiatric: She has a normal mood and affect. Her behavior is normal.  Nursing note and vitals reviewed.    ED Treatments / Results  Labs (all labs ordered are listed, but only abnormal results are displayed) Labs Reviewed  COMPREHENSIVE METABOLIC PANEL - Abnormal; Notable for the following:       Result Value   ALT 12 (*)    All other components within normal limits  URINALYSIS, ROUTINE W REFLEX MICROSCOPIC - Abnormal; Notable for the following:    Color, Urine STRAW (*)    Hgb urine dipstick SMALL (*)    Leukocytes, UA TRACE (*)    Squamous Epithelial / LPF 0-5 (*)    All other components within normal limits  CBC  I-STAT BETA HCG BLOOD, ED ([facility], [facility], [facility] ONLY)  POC OCCULT BLOOD, ED  TYPE AND SCREEN  ABO/RH    EKG  EKG Interpretation  Date/Time:  Friday November 03 2016 17:52:34 EDT Ventricular Rate:  102 PR Interval:  144 QRS Duration: 104 QT Interval:  352 QTC Calculation: 458 R Axis:   -42 Text Interpretation:  Sinus tachycardia Left axis deviation Low voltage QRS Incomplete right bundle branch block Abnormal ECG Confirmed by Tilden Fossa [phone]) on 11/03/2016 9:08:57 PM       Radiology US Transvaginal Non-ob  Result Date: 11/03/2016 CLINICAL DATA:  Initial evaluation for acute left lower quadrant pain. EXAM: TRANSABDOMINAL AND TRANSVAGINAL ULTRASOUND OF PELVIS DOPPLER ULTRASOUND OF OVARIES TECHNIQUE:  Both transabdominal and transvaginal ultrasound examinations of the pelvis were performed. Transabdominal technique was performed for global imaging of the pelvis including uterus, ovaries, adnexal regions, and pelvic cul-de-sac. It was necessary to proceed with endovaginal exam following the transabdominal exam to visualize the uterus and ovaries. Color and duplex Doppler ultrasound was utilized to evaluate blood flow to the ovaries. COMPARISON:  None. FINDINGS: Uterus Measurements: 6.8 x 5.0 x 5.7 cm. No fibroids or other mass visualized. Endometrium Thickness: 12.2 mm. No focal abnormality visualized. IUD appropriately position within the endometrial cavity. Right ovary Measurements: 3.3 x 2.5 x 2.5 cm. Normal appearance/no adnexal mass. Left ovary Measurements: 2.3 x 1.7 x 2.4  cm. Normal appearance/no adnexal mass. Pulsed Doppler evaluation of both ovaries demonstrates normal low-resistance arterial and venous waveforms. Other findings No abnormal free fluid. IMPRESSION: 1. Normal pelvic ultrasound. No acute abnormality identified. No evidence for ovarian torsion. 2. IUD appropriately positioned the within the endometrial cavity. Electronically Signed   By: Rise Mu M.D.   On: 11/03/2016 23:09   US Pelvis Complete  Result Date: 11/03/2016 CLINICAL DATA:  Initial evaluation for acute left lower quadrant pain. EXAM: TRANSABDOMINAL AND TRANSVAGINAL ULTRASOUND OF PELVIS DOPPLER ULTRASOUND OF OVARIES TECHNIQUE: Both transabdominal and transvaginal ultrasound examinations of the pelvis were performed. Transabdominal technique was performed for global imaging of the pelvis including uterus, ovaries, adnexal regions, and pelvic cul-de-sac. It was necessary to proceed with endovaginal exam following the transabdominal exam to visualize the uterus and ovaries. Color and duplex Doppler ultrasound was utilized to evaluate blood flow to the ovaries. COMPARISON:  None. FINDINGS: Uterus Measurements: 6.8 x 5.0  x 5.7 cm. No fibroids or other mass visualized. Endometrium Thickness: 12.2 mm. No focal abnormality visualized. IUD appropriately position within the endometrial cavity. Right ovary Measurements: 3.3 x 2.5 x 2.5 cm. Normal appearance/no adnexal mass. Left ovary Measurements: 2.3 x 1.7 x 2.4 cm. Normal appearance/no adnexal mass. Pulsed Doppler evaluation of both ovaries demonstrates normal low-resistance arterial and venous waveforms. Other findings No abnormal free fluid. IMPRESSION: 1. Normal pelvic ultrasound. No acute abnormality identified. No evidence for ovarian torsion. 2. IUD appropriately positioned the within the endometrial cavity. Electronically Signed   By: Rise Mu M.D.   On: 11/03/2016 23:09   [place] Art/ven Flow Abd Pelv Doppler  Result Date: 11/03/2016 CLINICAL DATA:  Initial evaluation for acute left lower quadrant pain. EXAM: TRANSABDOMINAL AND TRANSVAGINAL ULTRASOUND OF PELVIS DOPPLER ULTRASOUND OF OVARIES TECHNIQUE: Both transabdominal and transvaginal ultrasound examinations of the pelvis were performed. Transabdominal technique was performed for global imaging of the pelvis including uterus, ovaries, adnexal regions, and pelvic cul-de-sac. It was necessary to proceed with endovaginal exam following the transabdominal exam to visualize the uterus and ovaries. Color and duplex Doppler ultrasound was utilized to evaluate blood flow to the ovaries. COMPARISON:  None. FINDINGS: Uterus Measurements: 6.8 x 5.0 x 5.7 cm. No fibroids or other mass visualized. Endometrium Thickness: 12.2 mm. No focal abnormality visualized. IUD appropriately position within the endometrial cavity. Right ovary Measurements: 3.3 x 2.5 x 2.5 cm. Normal appearance/no adnexal mass. Left ovary Measurements: 2.3 x 1.7 x 2.4 cm. Normal appearance/no adnexal mass. Pulsed Doppler evaluation of both ovaries demonstrates normal low-resistance arterial and venous waveforms. Other findings No abnormal free fluid.  IMPRESSION: 1. Normal pelvic ultrasound. No acute abnormality identified. No evidence for ovarian torsion. 2. IUD appropriately positioned the within the endometrial cavity. Electronically Signed   By: Rise Mu M.D.   On: 11/03/2016 23:09    Procedures Procedures (including critical care time)  Medications Ordered in ED Medications  ibuprofen (ADVIL,MOTRIN) tablet 600 mg (600 mg Oral Given 11/03/16 2217)     Initial Impression / Assessment and Plan / ED Course  I have reviewed the triage vital signs and the nursing notes.  Pertinent labs & imaging results that were available during my care of the patient were reviewed by me and considered in my medical decision making (see chart for details).     Patient here for evaluation of hematochezia, left lower quadrant pain. She is no significant tenderness on examination. She does have an external hemorrhoid that appears to be likely source of bleeding. She  has no evidence of major hemorrhage on examination. Labs demonstrates stable hemoglobin. She is not on any anticoagulants. Patient does have history of ovarian cysts and has had similar abdominal pain in the past. Pelvic some obtained and is negative for ovarian cyst or torsion. Presentation is not consistent with renal colic. Discussed with patient home care for hematochezia, constipation. Discussed GI follow-up, home care, return precautions.  Final Clinical Impressions(s) / ED Diagnoses   Final diagnoses:  Hematochezia  External hemorrhoid, bleeding    New Prescriptions New Prescriptions   No medications on file     Tilden Fossa, MD 11/03/16 2330

## 2016-11-03 NOTE — ED Triage Notes (Signed)
Pt reports rectal bleeding x 3 years, with 2 colonoscopies. Unable to dx cause. Bleeding had stopped (6 months ago), but now is back and is painful, which is new. Is having clots. Having LLQ abdominal pain. Has been going on since yesterday, with bright red blood, dark red clots. Nauseated, no vomiting. She had been having palpations since yesterday, unsure if due to pain. Is a x 4.

## 2016-11-03 NOTE — ED Notes (Signed)
Patient transported to Ultrasound 

## 2016-11-07 ENCOUNTER — Other Ambulatory Visit: Payer: Self-pay | Admitting: Obstetrics and Gynecology

## 2016-11-07 DIAGNOSIS — R102 Pelvic and perineal pain: Secondary | ICD-10-CM

## 2016-11-13 ENCOUNTER — Other Ambulatory Visit

## 2017-01-25 ENCOUNTER — Emergency Department (HOSPITAL_COMMUNITY)

## 2017-01-25 ENCOUNTER — Ambulatory Visit
Admission: RE | Admit: 2017-01-25 | Discharge: 2017-01-25 | Disposition: A | Discharge status: Home / Self Care | Source: Ambulatory Visit | Attending: Obstetrics and Gynecology | Admitting: Obstetrics and Gynecology

## 2017-01-25 ENCOUNTER — Emergency Department (HOSPITAL_COMMUNITY)
Admission: EM | Admit: 2017-01-25 | Discharge: 2017-01-26 | Disposition: A | Discharge status: Home / Self Care | Attending: Emergency Medicine | Admitting: Emergency Medicine

## 2017-01-25 ENCOUNTER — Other Ambulatory Visit: Payer: Self-pay

## 2017-01-25 DIAGNOSIS — N83201 Unspecified ovarian cyst, right side: Secondary | ICD-10-CM | POA: Diagnosis not present

## 2017-01-25 DIAGNOSIS — N730 Acute parametritis and pelvic cellulitis: Secondary | ICD-10-CM | POA: Diagnosis not present

## 2017-01-25 DIAGNOSIS — R109 Unspecified abdominal pain: Secondary | ICD-10-CM | POA: Diagnosis present

## 2017-01-25 DIAGNOSIS — Z79899 Other long term (current) drug therapy: Secondary | ICD-10-CM | POA: Diagnosis not present

## 2017-01-25 DIAGNOSIS — R102 Pelvic and perineal pain: Secondary | ICD-10-CM

## 2017-01-25 LAB — CBC
HEMATOCRIT: 39.5 % (ref 36.0–46.0)
HEMOGLOBIN: 12.9 g/dL (ref 12.0–15.0)
MCH: 28.7 pg (ref 26.0–34.0)
MCHC: 32.7 g/dL (ref 30.0–36.0)
MCV: 88 fL (ref 78.0–100.0)
PLATELETS: 390 10*3/uL (ref 150–400)
RBC: 4.49 MIL/uL (ref 3.87–5.11)
RDW: 14.3 % (ref 11.5–15.5)
WBC: 16.4 10*3/uL — ABNORMAL HIGH (ref 4.0–10.5)

## 2017-01-25 LAB — COMPREHENSIVE METABOLIC PANEL
ALT: 13 U/L — AB (ref 14–54)
AST: 21 U/L (ref 15–41)
Albumin: 3.9 g/dL (ref 3.5–5.0)
Alkaline Phosphatase: 62 U/L (ref 38–126)
Anion gap: 7 (ref 5–15)
BUN: 8 mg/dL (ref 6–20)
CHLORIDE: 102 mmol/L (ref 101–111)
CO2: 26 mmol/L (ref 22–32)
CREATININE: 0.74 mg/dL (ref 0.44–1.00)
Calcium: 8.7 mg/dL — ABNORMAL LOW (ref 8.9–10.3)
GFR calc non Af Amer: >60 mL/min (ref >60)
Glucose, Bld: 77 mg/dL (ref 65–99)
POTASSIUM: 3.7 mmol/L (ref 3.5–5.1)
SODIUM: 135 mmol/L (ref 135–145)
Total Bilirubin: 0.3 mg/dL (ref 0.3–1.2)
Total Protein: 7.5 g/dL (ref 6.5–8.1)

## 2017-01-25 LAB — URINALYSIS, ROUTINE W REFLEX MICROSCOPIC
BILIRUBIN URINE: NEGATIVE
Glucose, UA: NEGATIVE mg/dL
KETONES UR: NEGATIVE mg/dL
LEUKOCYTES UA: NEGATIVE
Nitrite: NEGATIVE
PROTEIN: NEGATIVE mg/dL
Specific Gravity, Urine: >1.046 — ABNORMAL HIGH (ref 1.005–1.030)
pH: 8 (ref 5.0–8.0)

## 2017-01-25 LAB — I-STAT BETA HCG BLOOD, ED (MC, WL, AP ONLY)

## 2017-01-25 LAB — LIPASE, BLOOD: LIPASE: 26 U/L (ref 11–51)

## 2017-01-25 MED ORDER — ONDANSETRON HCL 4 MG/2ML IJ SOLN
4.0000 mg | Freq: Once | INTRAMUSCULAR | Status: AC
  Administered 2017-01-25: 4 mg via INTRAVENOUS
  Filled 2017-01-25: qty 2

## 2017-01-25 MED ORDER — SODIUM CHLORIDE 0.9 % IV BOLUS (SEPSIS)
1000.0000 mL | Freq: Once | INTRAVENOUS | Status: AC
  Administered 2017-01-25: 1000 mL via INTRAVENOUS

## 2017-01-25 MED ORDER — HYDROMORPHONE HCL 1 MG/ML IJ SOLN
1.0000 mg | Freq: Once | INTRAMUSCULAR | Status: AC
  Administered 2017-01-25: 1 mg via INTRAVENOUS
  Filled 2017-01-25: qty 1

## 2017-01-25 MED ORDER — IOPAMIDOL (ISOVUE-300) INJECTION 61%
100.0000 mL | Freq: Once | INTRAVENOUS | Status: AC | PRN
Start: 2017-01-25 — End: 2017-01-25
  Administered 2017-01-25: 100 mL via INTRAVENOUS

## 2017-01-25 MED ORDER — MORPHINE SULFATE (PF) 4 MG/ML IV SOLN
4.0000 mg | Freq: Once | INTRAVENOUS | Status: AC
  Administered 2017-01-25: 4 mg via INTRAVENOUS
  Filled 2017-01-25: qty 1

## 2017-01-25 MED ORDER — OXYCODONE-ACETAMINOPHEN 5-325 MG PO TABS
1.0000 | ORAL_TABLET | ORAL | Status: DC | PRN
  Administered 2017-01-25: 1 via ORAL
  Filled 2017-01-25: qty 1

## 2017-01-25 NOTE — ED Triage Notes (Signed)
Pt states she has had right flank pain and right abdominal pain with blood in urine, painful urination. Pt had a CT abdomen done 2 hours ago outpatient. Results are not back yet, pt was told to "follow up with her MD" but pain 10/10.

## 2017-01-25 NOTE — ED Notes (Signed)
Patient transported to Ultrasound 

## 2017-01-26 ENCOUNTER — Other Ambulatory Visit

## 2017-01-26 LAB — GC/CHLAMYDIA PROBE AMP (~~LOC~~) NOT AT ARMC
Chlamydia: NEGATIVE
Neisseria Gonorrhea: NEGATIVE

## 2017-01-26 LAB — WET PREP, GENITAL
Sperm: NONE SEEN
Trich, Wet Prep: NONE SEEN
Yeast Wet Prep HPF POC: NONE SEEN

## 2017-01-26 MED ORDER — DOXYCYCLINE HYCLATE 100 MG PO CAPS: 100.0000 mg | ORAL_CAPSULE | Freq: Two times a day (BID) | ORAL | 0 refills | Status: DC

## 2017-01-26 MED ORDER — HYDROCODONE-ACETAMINOPHEN 5-325 MG PO TABS: 1.0000 | ORAL_TABLET | Freq: Four times a day (QID) | ORAL | 0 refills | Status: DC | PRN

## 2017-01-26 MED ORDER — PROMETHAZINE HCL 25 MG/ML IJ SOLN
25.0000 mg | Freq: Once | INTRAMUSCULAR | Status: AC
  Administered 2017-01-26: 25 mg via INTRAVENOUS
  Filled 2017-01-26: qty 1

## 2017-01-26 MED ORDER — PROMETHAZINE HCL 25 MG PO TABS: 25.0000 mg | ORAL_TABLET | Freq: Four times a day (QID) | ORAL | 0 refills | Status: AC | PRN

## 2017-01-26 MED ORDER — LIDOCAINE HCL (PF) 1 % IJ SOLN
INTRAMUSCULAR | Status: AC
  Administered 2017-01-26: 5 mL
  Filled 2017-01-26: qty 5

## 2017-01-26 MED ORDER — KETOROLAC TROMETHAMINE 30 MG/ML IJ SOLN
30.0000 mg | Freq: Once | INTRAMUSCULAR | Status: AC
  Administered 2017-01-26: 30 mg via INTRAVENOUS
  Filled 2017-01-26: qty 1

## 2017-01-26 MED ORDER — CEFTRIAXONE SODIUM 250 MG IJ SOLR
250.0000 mg | Freq: Once | INTRAMUSCULAR | Status: AC
  Administered 2017-01-26: 250 mg via INTRAMUSCULAR
  Filled 2017-01-26: qty 250

## 2017-01-26 NOTE — ED Notes (Signed)
ED Provider at bedside. 

## 2017-01-26 NOTE — Discharge Instructions (Signed)
Follow-up with your primary doctor.  Return here as needed.  I feel that you have a pelvic infection that is causing her pain.  Make sure that you take all of your antibiotics

## 2017-01-26 NOTE — ED Provider Notes (Signed)
[facility] EMERGENCY DEPARTMENT Provider Note   CSN: [id number] Arrival date & time: 01/25/17  1521     History   Chief Complaint Chief Complaint  Patient presents with  . Flank Pain  . Abdominal Pain    HPI Katie Rose is a 26 y.o. female.  HPI Patient presents to the emergency department with right lower abdominal discomfort that started Sunday which initially was mostly in her back.  States that seen by her doctor today who did a CT scan.  Patient states that she is having increasing pain and that is what brought her to the ER.  Patient states she did not receive the CT scan results.  The patient denies chest pain, shortness of breath, headache,blurred vision, neck pain, fever, cough, weakness, numbness, dizziness, anorexia, edema,vomiting, diarrhea, rash, back pain, dysuria, hematemesis, bloody stool, near syncope, or syncope.  Patient mentioned that she did have some discomfort with urination.  She initially thought she was having kidney stone type pain which she has never had in the past Past Medical History:  Diagnosis Date  . Anemia    history  . Gastroesophageal reflux in pregancy    diet controlled and tums  . H/O oligohydramnios in prior pregnancy, currently pregnant   . Headache(784.0)   . No pertinent past medical history   . Ovarian cyst   . Preterm labor   . SAB (spontaneous abortion)    x2 - SAB x 1, and scar tissue removed from c/s incision site    Patient Active Problem List   Diagnosis Date Noted  . Migraine with aura 08/06/2012  . 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 Rapid palpitations 06/05/2012  . History of cesarean delivery 03/05/2012  . Dysuria 03/05/2012    Past Surgical History:  Procedure Laterality Date  . CESAREAN SECTION  03/2004  . CESAREAN SECTION N/A 08/14/2012   Procedure: REPEAT CESAREAN SECTION;  Surgeon: Catalina Antigua, MD;  Location: [facility];  Service: Obstetrics;  Laterality: N/A;  . DILATION AND  CURETTAGE OF UTERUS     x 2  . LAPAROSCOPY     scar tissue from c/s incision site  . WISDOM TOOTH EXTRACTION     x4    OB History    Gravida Para Term Preterm AB Living   4 2 1 1 2 2    SAB TAB Ectopic Multiple Live Births   2       2       Home Medications    Prior to Admission medications   Medication Sig Start Date End Date Taking? Authorizing Provider  acetaminophen (TYLENOL) 500 MG tablet Take 500 mg by mouth every 6 (six) hours as needed for mild pain.   Yes [provider]  PARAGARD INTRAUTERINE COPPER IUD IUD 1 each by Intrauterine route once.   Yes [provider]  topiramate (TOPAMAX) 100 MG tablet Take 100 mg by mouth daily.  05/10/14  Yes [provider]  EPINEPHrine (EPIPEN 2-PAK) 0.3 mg/0.3 mL IJ SOAJ injection Inject 0.3 mLs (0.3 mg total) into the muscle once. Patient not taking: Reported on 11/03/2016 09/04/15   Phineas Semen, MD  fluconazole (DIFLUCAN) 150 MG tablet Take 1 tablet (150 mg total) by mouth once. Can take additional dose three days later if symptoms persist Patient not taking: Reported on 11/03/2016 09/19/14   Katrinka Blazing, [place], CNM  promethazine (PHENERGAN) 12.5 MG tablet Take 1 tablet (12.5 mg total) by mouth every 6 (six) hours as needed for nausea or vomiting.  Patient not taking: Reported on 11/03/2016 09/04/15   Phineas SemenGoodman, [place], MD    Family History Family History  Problem Relation Age of Onset  . Alcohol abuse Father   . Cancer Maternal Grandmother        cervical  . Hypertension Mother   . Other Neg Hx     Social History Social History   Tobacco Use  . Smoking status: Never Smoker  . Smokeless tobacco: Never Used  Substance Use Topics  . Alcohol use: No  . Drug use: No     Allergies   Patient has no known allergies.   Review of Systems Review of Systems All other systems negative except as documented in the HPI. All pertinent positives and negatives as reviewed in the HPI.  Physical Exam Updated  Vital Signs BP (!) 97/56 (BP Location: Left Arm)   Pulse 84   Temp 98.8 F (37.1 C) (Oral)   Resp 16   Ht 5\' 3"  (1.6 m)   Wt 63.5 kg (140 lb)   LMP 01/06/2017   SpO2 98%   BMI 24.80 kg/m   Physical Exam  Constitutional: She is oriented to person, place, and time. She appears well-developed and well-nourished. No distress.  HENT:  Head: Normocephalic and atraumatic.  Mouth/Throat: Oropharynx is clear and moist.  Eyes: Pupils are equal, round, and reactive to light.  Neck: Normal range of motion. Neck supple.  Cardiovascular: Normal rate, regular rhythm and normal heart sounds. Exam reveals no gallop and no friction rub.  No murmur heard. Pulmonary/Chest: Effort normal and breath sounds normal. No respiratory distress. She has no wheezes.  Abdominal: Soft. Bowel sounds are normal. She exhibits no distension. There is no tenderness.  Genitourinary: Pelvic exam was performed with patient supine. Cervix exhibits motion tenderness and discharge. Right adnexum displays tenderness. Left adnexum displays tenderness. Vaginal discharge found.  Neurological: She is alert and oriented to person, place, and time. She exhibits normal muscle tone. Coordination normal.  Skin: Skin is warm and dry. Capillary refill takes less than 2 seconds. No rash noted. No erythema.  Psychiatric: She has a normal mood and affect. Her behavior is normal.  Nursing note and vitals reviewed.    ED Treatments / Results  Labs (all labs ordered are listed, but only abnormal results are displayed) Labs Reviewed  COMPREHENSIVE METABOLIC PANEL - Abnormal; Notable for the following components:      Result Value   Calcium 8.7 (*)    ALT 13 (*)    All other components within normal limits  CBC - Abnormal; Notable for the following components:   WBC 16.4 (*)    All other components within normal limits  URINALYSIS, ROUTINE W REFLEX MICROSCOPIC - Abnormal; Notable for the following components:   Color, Urine STRAW (*)     Specific Gravity, Urine >1.046 (*)    Hgb urine dipstick SMALL (*)    Bacteria, UA RARE (*)    Squamous Epithelial / LPF 0-5 (*)    All other components within normal limits  WET PREP, GENITAL  LIPASE, BLOOD  I-STAT BETA HCG BLOOD, ED ([facility], [facility], [facility] ONLY)  GC/CHLAMYDIA PROBE AMP ([place]) NOT AT [facility]    EKG  EKG Interpretation None       Radiology [place] Pelvis Transvanginal Non-ob (tv Only)  Result Date: 01/26/2017 CLINICAL DATA:  Initial evaluation for acute right-sided pelvic pain for 4 days. EXAM: TRANSABDOMINAL AND TRANSVAGINAL ULTRASOUND OF PELVIS DOPPLER ULTRASOUND OF OVARIES TECHNIQUE: Both transabdominal and transvaginal  ultrasound examinations of the pelvis were performed. Transabdominal technique was performed for global imaging of the pelvis including uterus, ovaries, adnexal regions, and pelvic cul-de-sac. It was necessary to proceed with endovaginal exam following the transabdominal exam to visualize the uterus and ovaries. Color and duplex Doppler ultrasound was utilized to evaluate blood flow to the ovaries. COMPARISON:  Prior CT from earlier the same day. FINDINGS: Uterus Measurements: 8.5 x 4.6 x 5.6 cm. No fibroids or other mass visualized. Endometrium Thickness: 13.4 mm. No focal abnormality visualized. IUD in appropriate position within the endometrial canal. Right ovary Measurements: 3.0 x 2.5 x 3.0 cm. Normal appearance/no adnexal mass. 2.3 x 2.0 x 2.3 cm simple anechoic cyst, consistent with a normal physiologic cyst. Finding corresponds the previously noted cyst seen on prior CT. No internal vascularity or other concerning features. Left ovary Measurements: 2.5 x 1.9 x 2.2 cm. Normal appearance/no adnexal mass. Pulsed Doppler evaluation of both ovaries demonstrates normal low-resistance arterial and venous waveforms. Other findings Trace free fluid within the pelvis, likely physiologic. IMPRESSION: 1. 2.3 cm simple right ovarian cyst, most consistent with a normal  physiologic cyst. No further follow-up imaging is recommended regarding this finding. This recommendation follows the consensus statement: Management of Asymptomatic Ovarian and Other Adnexal Cysts Imaged at [place]: Society of Radiologists in Ultrasound Consensus Conference Statement. Radiology 2010; [phone]. 2. No other acute abnormality within the pelvis. No evidence for torsion. Electronically Signed   By: Rise Mu M.D.   On: 01/26/2017 00:21   US Pelvis Complete  Result Date: 01/26/2017 CLINICAL DATA:  Initial evaluation for acute right-sided pelvic pain for 4 days. EXAM: TRANSABDOMINAL AND TRANSVAGINAL ULTRASOUND OF PELVIS DOPPLER ULTRASOUND OF OVARIES TECHNIQUE: Both transabdominal and transvaginal ultrasound examinations of the pelvis were performed. Transabdominal technique was performed for global imaging of the pelvis including uterus, ovaries, adnexal regions, and pelvic cul-de-sac. It was necessary to proceed with endovaginal exam following the transabdominal exam to visualize the uterus and ovaries. Color and duplex Doppler ultrasound was utilized to evaluate blood flow to the ovaries. COMPARISON:  Prior CT from earlier the same day. FINDINGS: Uterus Measurements: 8.5 x 4.6 x 5.6 cm. No fibroids or other mass visualized. Endometrium Thickness: 13.4 mm. No focal abnormality visualized. IUD in appropriate position within the endometrial canal. Right ovary Measurements: 3.0 x 2.5 x 3.0 cm. Normal appearance/no adnexal mass. 2.3 x 2.0 x 2.3 cm simple anechoic cyst, consistent with a normal physiologic cyst. Finding corresponds the previously noted cyst seen on prior CT. No internal vascularity or other concerning features. Left ovary Measurements: 2.5 x 1.9 x 2.2 cm. Normal appearance/no adnexal mass. Pulsed Doppler evaluation of both ovaries demonstrates normal low-resistance arterial and venous waveforms. Other findings Trace free fluid within the pelvis, likely physiologic. IMPRESSION:  1. 2.3 cm simple right ovarian cyst, most consistent with a normal physiologic cyst. No further follow-up imaging is recommended regarding this finding. This recommendation follows the consensus statement: Management of Asymptomatic Ovarian and Other Adnexal Cysts Imaged at [place]: Society of Radiologists in Ultrasound Consensus Conference Statement. Radiology 2010; [phone]. 2. No other acute abnormality within the pelvis. No evidence for torsion. Electronically Signed   By: Rise Mu M.D.   On: 01/26/2017 00:21   Ct Abdomen Pelvis W Contrast  Result Date: 01/25/2017 CLINICAL DATA:  Right lower quadrant and back pain for 4 days. Hematochezia. EXAM: CT ABDOMEN AND PELVIS WITH CONTRAST TECHNIQUE: Multidetector CT imaging of the abdomen and pelvis was performed using the standard protocol following bolus  administration of intravenous contrast. CONTRAST:  100mL ISOVUE-300 IOPAMIDOL (ISOVUE-300) INJECTION 61% COMPARISON:  06/18/2013 FINDINGS: Lower Chest: No acute findings. Hepatobiliary: No hepatic masses identified. Gallbladder is unremarkable. Pancreas:  No mass or inflammatory changes. Spleen: Within normal limits in size and appearance. Adrenals/Urinary Tract: No masses identified. No evidence of hydronephrosis. Stomach/Bowel: No evidence of obstruction, inflammatory process or abnormal fluid collections. Normal appendix visualized. Vascular/Lymphatic: No pathologically enlarged lymph nodes. No abdominal aortic aneurysm. Reproductive: IUD again seen in expected position. A 2.3 cm benign-appearing right ovarian cyst is seen as well as a small amount of free pelvic fluid, most likely physiologic. Other:  None. Musculoskeletal:  No suspicious bone lesions identified. IMPRESSION: 2.3 cm benign-appearing right ovarian cyst and small amount of free pelvic fluid, most likely physiologic. IUD in expected position. Electronically Signed   By: Myles RosenthalJohn  Stahl M.D.   On: 01/25/2017 19:02   [place] Pelvic Doppler  (torsion R/o Or Mass Arterial Flow)  Result Date: 01/26/2017 CLINICAL DATA:  Initial evaluation for acute right-sided pelvic pain for 4 days. EXAM: TRANSABDOMINAL AND TRANSVAGINAL ULTRASOUND OF PELVIS DOPPLER ULTRASOUND OF OVARIES TECHNIQUE: Both transabdominal and transvaginal ultrasound examinations of the pelvis were performed. Transabdominal technique was performed for global imaging of the pelvis including uterus, ovaries, adnexal regions, and pelvic cul-de-sac. It was necessary to proceed with endovaginal exam following the transabdominal exam to visualize the uterus and ovaries. Color and duplex Doppler ultrasound was utilized to evaluate blood flow to the ovaries. COMPARISON:  Prior CT from earlier the same day. FINDINGS: Uterus Measurements: 8.5 x 4.6 x 5.6 cm. No fibroids or other mass visualized. Endometrium Thickness: 13.4 mm. No focal abnormality visualized. IUD in appropriate position within the endometrial canal. Right ovary Measurements: 3.0 x 2.5 x 3.0 cm. Normal appearance/no adnexal mass. 2.3 x 2.0 x 2.3 cm simple anechoic cyst, consistent with a normal physiologic cyst. Finding corresponds the previously noted cyst seen on prior CT. No internal vascularity or other concerning features. Left ovary Measurements: 2.5 x 1.9 x 2.2 cm. Normal appearance/no adnexal mass. Pulsed Doppler evaluation of both ovaries demonstrates normal low-resistance arterial and venous waveforms. Other findings Trace free fluid within the pelvis, likely physiologic. IMPRESSION: 1. 2.3 cm simple right ovarian cyst, most consistent with a normal physiologic cyst. No further follow-up imaging is recommended regarding this finding. This recommendation follows the consensus statement: Management of Asymptomatic Ovarian and Other Adnexal Cysts Imaged at [place]: Society of Radiologists in Ultrasound Consensus Conference Statement. Radiology 2010; [phone]. 2. No other acute abnormality within the pelvis. No evidence for  torsion. Electronically Signed   By: Rise MuBenjamin  McClintock M.D.   On: 01/26/2017 00:21    Procedures Procedures (including critical care time)  Medications Ordered in ED Medications  oxyCODONE-acetaminophen (PERCOCET/ROXICET) 5-325 MG per tablet 1 tablet (1 tablet Oral Given 01/25/17 1605)  cefTRIAXone (ROCEPHIN) injection 250 mg (not administered)  sodium chloride 0.9 % bolus 1,000 mL (0 mLs Intravenous Stopped 01/25/17 2221)  morphine 4 MG/ML injection 4 mg (4 mg Intravenous Given 01/25/17 2030)  HYDROmorphone (DILAUDID) injection 1 mg (1 mg Intravenous Given 01/25/17 2127)  ondansetron (ZOFRAN) injection 4 mg (4 mg Intravenous Given 01/25/17 2219)  ketorolac (TORADOL) 30 MG/ML injection 30 mg (30 mg Intravenous Given 01/26/17 0101)  promethazine (PHENERGAN) injection 25 mg (25 mg Intravenous Given 01/26/17 0101)     Initial Impression / Assessment and Plan / ED Course  I have reviewed the triage vital signs and the nursing notes.  Pertinent labs & imaging results that  were available during my care of the patient were reviewed by me and considered in my medical decision making (see chart for details).    Patient most likely has PID based on her physical exam findings and the fact that she has only a small benign cyst.  Patient be treated for this and advised to return for any worsening in her condition.  Told to follow-up with her primary doctor.  Patient is stable here in the emergency department and her vital signs remained stable as well.  She is not showing any signs of sepsis at this time  Final Clinical Impressions(s) / ED Diagnoses   Final diagnoses:  Abdominal pain    ED Discharge Orders    None       Charlestine Night, PA-C 01/26/17 0121    Derwood Kaplan, MD 01/26/17 1511

## 2017-02-19 NOTE — Progress Notes (Deleted)
02/20/2017 8:41 PM   Katie Rose 1990/06/03 [id number]  Referring provider: Suzy Bouchard, MD [address] [facility], [place] [address]  No chief complaint on file.   HPI: Patient is a 27 year old Caucasian female who is referred by Pearlean Brownie, CNMW for urinary frequency.  She is having associated urinary incontinence, urgency, dysuria, nocturia, intermittency, hesitancy, straining to urinate and weak urinary stream.   ***  She does/does not have a history of urinary tract infections, STI's or injury to the bladder. ***  She denies/endorses dysuria, gross hematuria, suprapubic pain, back pain, abdominal pain or flank pain.***  She has not had any recent fevers, chills, nausea or vomiting. ***  She does/does not have a history of nephrolithiasis, GU surgery or GU trauma. ***  She is/is not sexually active.  She has/has not noted incontinence with sexual intercourse.  ***   She admits to/denies constipation and/or diarrhea. ***  She is having/ not having pain with bladder filling.  ***  Contrast CT performed in 01/2017 noted adrenals/urinary Tract: No masses identified. No evidence of hydronephrosis.  2.3 cm benign-appearing right ovarian cyst and small amount of free pelvic fluid, most likely physiologic.  IUD in expected position.  She is drinking *** of water daily.   She is drinking *** caffeinated beverages daily.  She is drinking *** alcoholic beverages daily.    Her risk factors for incontinence are obesity, para T, vaginal delivery, a family history of incontinence, age, smoking, caffeine, diabetes, stroke, depression, fecal incontinence, vaginal atrophy, oral estrogens, pelvic surgery, pelvic radiation and/or neurological disorders. ***  She is taking antihistamines, decongestants, benzo's, opioids, OAB medication, ACE inhibitors, alpha-agonists, alpha blockers, antiarrhythmics, diuretics, antidepressants,  antipsychotics, skeletal muscle relaxants and/or oral estrogen.    Reviewed referral notes.   1. Recurrent pain Pt was first seen in our office for pelvic pain in September of this year. She reports that the pain has continued to be a problem, but has been intermittent in that time period. She has had left sided pelvic pain, lower back pain, right sided pelvic pain, and lower abdominal pain. Pelvic ultrasound in September was normal with no acute abnormality identified, no evidence for ovarian torsion, with her Paragard IUD appropriately positioned. At that point, Dr. Feliberto Gottron ordered a CT of the abdomen and pelvis, but she did not complete it because her pain was improved.   Pt was seen again in the office 3 days ago, at which point she was reporting lower back and left sided pelvic pain. She was treated for PID with symptoms of cervical motion and left adnexal tenderness and ordered a pelvic ultrasound. Due to office limitations related to weather related closures earlier in the week, the ultrasound could not be scheduled until today.   Yesterday, pt called the office reporting 10/10 pelvic pain, at which point she was instructed to go to the ED. She received the CT scan of the abdomen and pelvis as an outpatient before presenting to the ED, as she had the order. The CT was normal, showing her IUD in the expected position and a 2.3 cm benign-appearing right ovarian cyst. She also underwent a pelvic ultrasound which also showed a 2.3 cm simple right ovarian cyst, most consistent with a normal physiologic cyst, and no evidence for torsion. Her pain was treated with IV and PO medications and she was discharged from the ED.  Case was reviewed with Dr. Jennell Corner, who recommended GI and/or urology  referrals as needed based on history, COCs in case the pain were to be endometriosis, and close follow-up. In reviewing the pt's medical and medication history more closely, her history of migraines  with aura preclude the use of any combination hormonal methods.   -advised to use pain medication prescribed by ED as needed to manage pain -recommended follow-up in this office with one of MDs in about 2 weeks to further discuss management options  2. Urinary frequency Pt reports urinary frequency for the last 2-3 years. She states that she voids as frequency as every hour during the day and that she wakes up every night to void, ranging between 1 to 3 times a night. She initially thought she was urinating so often because of how much water she drank, but she experienced the same frequency. She does not experience hematuria, dysuria, or urgency.       PMH: Past Medical History:  Diagnosis Date  . Anemia    history  . Gastroesophageal reflux in pregancy    diet controlled and tums  . H/O oligohydramnios in prior pregnancy, currently pregnant   . Headache(784.0)   . No pertinent past medical history   . Ovarian cyst   . Preterm labor   . SAB (spontaneous abortion)    x2 - SAB x 1, and scar tissue removed from c/s incision site    Surgical History: Past Surgical History:  Procedure Laterality Date  . CESAREAN SECTION  03/2004  . CESAREAN SECTION N/A 08/14/2012   Procedure: REPEAT CESAREAN SECTION;  Surgeon: Catalina AntiguaPeggy Constant, MD;  Location: [facility];  Service: Obstetrics;  Laterality: N/A;  . DILATION AND CURETTAGE OF UTERUS     x 2  . LAPAROSCOPY     scar tissue from c/s incision site  . WISDOM TOOTH EXTRACTION     x4    Home Medications:  Allergies as of 02/20/2017   No Known Allergies     Medication List        Accurate as of 02/19/17  8:41 PM. Always use your most recent med list.          acetaminophen 500 MG tablet Commonly known as:  TYLENOL Take 500 mg by mouth every 6 (six) hours as needed for mild pain.   doxycycline 100 MG capsule Commonly known as:  VIBRAMYCIN Take 1 capsule (100 mg total) by mouth 2 (two) times daily.   EPINEPHrine 0.3 mg/0.3 mL Soaj  injection Commonly known as:  EPIPEN 2-PAK Inject 0.3 mLs (0.3 mg total) into the muscle once.   fluconazole 150 MG tablet Commonly known as:  DIFLUCAN Take 1 tablet (150 mg total) by mouth once. Can take additional dose three days later if symptoms persist   HYDROcodone-acetaminophen 5-325 MG tablet Commonly known as:  NORCO/VICODIN Take 1 tablet by mouth every 6 (six) hours as needed for moderate pain.   PARAGARD INTRAUTERINE COPPER Iud IUD 1 each by Intrauterine route once.   promethazine 25 MG tablet Commonly known as:  PHENERGAN Take 1 tablet (25 mg total) by mouth every 6 (six) hours as needed for nausea or vomiting.   topiramate 100 MG tablet Commonly known as:  TOPAMAX Take 100 mg by mouth daily.       Allergies: No Known Allergies  Family History: Family History  Problem Relation Age of Onset  . Alcohol abuse Father   . Cancer Maternal Grandmother        cervical  . Hypertension Mother   . Other Neg  Hx     Social History:  reports that  has never smoked. she has never used smokeless tobacco. She reports that she does not drink alcohol or use drugs.  ROS:                                        Physical Exam: There were no vitals taken for this visit.  Constitutional: Well nourished. Alert and oriented, No acute distress. HEENT:  AT, moist mucus membranes. Trachea midline, no masses. Cardiovascular: No clubbing, cyanosis, or edema. Respiratory: Normal respiratory effort, no increased work of breathing. GI: Abdomen is soft, non tender, non distended, no abdominal masses. Liver and spleen not palpable.  No hernias appreciated.  Stool sample for occult testing is not indicated.   GU: No CVA tenderness.  No bladder fullness or masses.  Normal external genitalia, normal pubic hair distribution, no lesions.  Normal urethral meatus, no lesions, no prolapse, no discharge.   No urethral masses, tenderness and/or tenderness. No bladder  fullness, tenderness or masses. Normal vagina mucosa, good estrogen effect, no discharge, no lesions, good pelvic support, no cystocele or rectocele noted.  No cervical motion tenderness.  Uterus is freely mobile and non-fixed.  No adnexal/parametria masses or tenderness noted.  Anus and perineum are without rashes or lesions.   *** Skin: No rashes, bruises or suspicious lesions. Lymph: No cervical or inguinal adenopathy. Neurologic: Grossly intact, no focal deficits, moving all 4 extremities. Psychiatric: Normal mood and affect.  Laboratory Data: Lab Results  Component Value Date   WBC 16.4 (H) 01/25/2017   HGB 12.9 01/25/2017   HCT 39.5 01/25/2017   MCV 88.0 01/25/2017   PLT 390 01/25/2017    Lab Results  Component Value Date   CREATININE 0.74 01/25/2017     Lab Results  Component Value Date   AST 21 01/25/2017   Lab Results  Component Value Date   ALT 13 (L) 01/25/2017    Urinalysis    Component Value Date/Time   COLORURINE STRAW (A) 01/25/2017 1559   APPEARANCEUR CLEAR 01/25/2017 1559   APPEARANCEUR Clear 07/17/2012 1545   LABSPEC >1.046 (H) 01/25/2017 1559   LABSPEC 1.012 07/17/2012 1545   PHURINE 8.0 01/25/2017 1559   GLUCOSEU NEGATIVE 01/25/2017 1559   GLUCOSEU Negative 07/17/2012 1545   HGBUR SMALL (A) 01/25/2017 1559   BILIRUBINUR NEGATIVE 01/25/2017 1559   BILIRUBINUR Negative 07/17/2012 1545   KETONESUR NEGATIVE 01/25/2017 1559   PROTEINUR NEGATIVE 01/25/2017 1559   UROBILINOGEN 0.2 08/11/2012 1832   NITRITE NEGATIVE 01/25/2017 1559   LEUKOCYTESUR NEGATIVE 01/25/2017 1559   LEUKOCYTESUR Negative 07/17/2012 1545    I have reviewed the labs.   Pertinent Imaging: *** I have independently reviewed the films.    Assessment & Plan:  ***  1. Frequency  No Follow-up on file.  These notes generated with voice recognition software. I apologize for typographical errors.  Michiel Cowboy, PA-C  [facility] [address], [address] [place], [place] [address] [phone]

## 2017-02-20 ENCOUNTER — Encounter: Payer: Self-pay | Admitting: Urology

## 2017-02-20 ENCOUNTER — Ambulatory Visit: Admitting: Urology

## 2017-07-23 ENCOUNTER — Ambulatory Visit: Payer: Self-pay | Admitting: Obstetrics and Gynecology

## 2017-08-28 ENCOUNTER — Encounter: Payer: Self-pay | Admitting: Emergency Medicine

## 2017-08-28 ENCOUNTER — Other Ambulatory Visit: Payer: Self-pay

## 2017-08-28 ENCOUNTER — Emergency Department
Admission: EM | Admit: 2017-08-28 | Discharge: 2017-08-28 | Disposition: A | Discharge status: Home / Self Care | Attending: Emergency Medicine | Admitting: Emergency Medicine

## 2017-08-28 ENCOUNTER — Emergency Department

## 2017-08-28 DIAGNOSIS — M545 Low back pain, unspecified: Secondary | ICD-10-CM

## 2017-08-28 DIAGNOSIS — R1084 Generalized abdominal pain: Secondary | ICD-10-CM | POA: Insufficient documentation

## 2017-08-28 DIAGNOSIS — M62830 Muscle spasm of back: Secondary | ICD-10-CM | POA: Insufficient documentation

## 2017-08-28 DIAGNOSIS — Z79899 Other long term (current) drug therapy: Secondary | ICD-10-CM | POA: Insufficient documentation

## 2017-08-28 LAB — URINALYSIS, COMPLETE (UACMP) WITH MICROSCOPIC
Bacteria, UA: NONE SEEN
Bilirubin Urine: NEGATIVE
GLUCOSE, UA: NEGATIVE mg/dL
Ketones, ur: NEGATIVE mg/dL
Nitrite: NEGATIVE
PH: 6 (ref 5.0–8.0)
Protein, ur: NEGATIVE mg/dL
SPECIFIC GRAVITY, URINE: 1.004 — AB (ref 1.005–1.030)

## 2017-08-28 LAB — POCT PREGNANCY, URINE: Preg Test, Ur: NEGATIVE

## 2017-08-28 MED ORDER — HYDROCODONE-ACETAMINOPHEN 5-325 MG PO TABS
1.0000 | ORAL_TABLET | Freq: Once | ORAL | Status: AC
Start: 2017-08-28 — End: 2017-08-28
  Administered 2017-08-28: 1 via ORAL
  Filled 2017-08-28: qty 1

## 2017-08-28 MED ORDER — TRAMADOL HCL 50 MG PO TABS: 50.0000 mg | ORAL_TABLET | Freq: Four times a day (QID) | ORAL | 0 refills | Status: AC | PRN

## 2017-08-28 MED ORDER — CYCLOBENZAPRINE HCL 5 MG PO TABS: 5.0000 mg | ORAL_TABLET | Freq: Three times a day (TID) | ORAL | 0 refills | Status: AC | PRN

## 2017-08-28 MED ORDER — CYCLOBENZAPRINE HCL 10 MG PO TABS
5.0000 mg | ORAL_TABLET | Freq: Once | ORAL | Status: AC
  Administered 2017-08-28: 5 mg via ORAL
  Filled 2017-08-28: qty 1

## 2017-08-28 NOTE — ED Provider Notes (Signed)
[facility] Emergency Department Provider Note   ____________________________________________   First MD Initiated Contact with Patient 08/28/17 1315     (approximate)  I have reviewed the triage vital signs and the nursing notes.   HISTORY  Chief Complaint Back Pain   HPI Katie Rose is a 27 y.o. female is here with complaint of right lower back pain that started this morning.  Patient denies any hematuria or urinary symptoms.  Patient also does not have any vaginal discharge.  There has been episodes of nausea without vomiting.  She states that pain is intermittent and severe like a muscle spasm at times.  Patient has had hematuria in the past and was supposed to have gotten a CT to look for stones.  Patient did not follow-up or get the CT.  Currently she rates her pain as a 10/10.   Past Medical History:  Diagnosis Date  . Anemia    history  . Gastroesophageal reflux in pregancy    diet controlled and tums  . H/O oligohydramnios in prior pregnancy, currently pregnant   . Headache(784.0)   . No pertinent past medical history   . Ovarian cyst   . Preterm labor   . SAB (spontaneous abortion)    x2 - SAB x 1, and scar tissue removed from c/s incision site    Patient Active Problem List   Diagnosis Date Noted  . Migraine with aura 08/06/2012  . 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 Rapid palpitations 06/05/2012  . History of cesarean delivery 03/05/2012  . Dysuria 03/05/2012    Past Surgical History:  Procedure Laterality Date  . CESAREAN SECTION  03/2004  . CESAREAN SECTION N/A 08/14/2012   Procedure: REPEAT CESAREAN SECTION;  Surgeon: Catalina AntiguaPeggy Constant, MD;  Location: [facility];  Service: Obstetrics;  Laterality: N/A;  . DILATION AND CURETTAGE OF UTERUS     x 2  . LAPAROSCOPY     scar tissue from c/s incision site  . WISDOM TOOTH EXTRACTION     x4    Prior to Admission medications   Medication Sig Start Date End Date Taking? Authorizing  Provider  cyclobenzaprine (FLEXERIL) 5 MG tablet Take 1 tablet (5 mg total) by mouth 3 (three) times daily as needed for muscle spasms. 08/28/17   Tommi RumpsSummers, Rhonda L, PA-C  EPINEPHrine (EPIPEN 2-PAK) 0.3 mg/0.3 mL IJ SOAJ injection Inject 0.3 mLs (0.3 mg total) into the muscle once. Patient not taking: Reported on 11/03/2016 09/04/15   Phineas SemenGoodman, Graydon, MD  [facility] INTRA[facility] COPPER IUD IUD 1 each by Intrauterine route once.    [provider]  promethazine (PHENERGAN) 25 MG tablet Take 1 tablet (25 mg total) by mouth every 6 (six) hours as needed for nausea or vomiting. 01/26/17   Lawyer, Cristal Deerhristopher, PA-C  topiramate (TOPAMAX) 100 MG tablet Take 100 mg by mouth daily.  05/10/14   [provider]  traMADol (ULTRAM) 50 MG tablet Take 1 tablet (50 mg total) by mouth every 6 (six) hours as needed. 08/28/17   Tommi RumpsSummers, Rhonda L, PA-C    Allergies Patient has no known allergies.  Family History  Problem Relation Age of Onset  . Alcohol abuse Father   . Cancer Maternal Grandmother        cervical  . Hypertension Mother   . Other Neg Hx     Social History Social History   Tobacco Use  . Smoking status: Never Smoker  . Smokeless tobacco: Never Used  Substance Use Topics  .  Alcohol use: Yes    Comment: occ.  . Drug use: No    Review of Systems Constitutional: No fever/chills Cardiovascular: Denies chest pain. Respiratory: Denies shortness of breath. Gastrointestinal: Positive nausea, no vomiting.   Musculoskeletal: Positive for low back pain. Skin: Negative for rash. Neurological: Negative for headaches, focal weakness or numbness. ____________________________________________   PHYSICAL EXAM:  VITAL SIGNS: ED Triage Vitals  Enc Vitals Group     BP 08/28/17 1246 118/63     Pulse Rate 08/28/17 1246 95     Resp 08/28/17 1246 18     Temp 08/28/17 1246 98.9 F (37.2 C)     Temp Source 08/28/17 1246 Oral     SpO2 08/28/17 1246 100 %     Weight 08/28/17 1249  140 lb (63.5 kg)     Height 08/28/17 1249 5\' 3"  (1.6 m)     Head Circumference --      Peak Flow --      Pain Score 08/28/17 1248 10     Pain Loc --      Pain Edu? --      Excl. in GC? --    Constitutional: Alert and oriented. Well appearing and in no acute distress. Eyes: Conjunctivae are normal.  Head: Atraumatic. Nose: No congestion/rhinnorhea. Neck: No stridor.   Cardiovascular: Normal rate, regular rhythm. Grossly normal heart sounds.  Good peripheral circulation. Respiratory: Normal respiratory effort.  No retractions. Lungs CTAB. Gastrointestinal: Soft and nontender. No distention. No CVA tenderness. Musculoskeletal: Examination of the back there is no gross deformity however patient is having a great deal of difficulty with range of motion.  She is pacing in the room at times due to pain.  Patient is able to ambulate without assistance.  There is no soft tissue edema noted.  There is no tenderness on palpation of the lumbar spine.  Pain is more right flank area and right sacral area. Neurologic:  Normal speech and language. No gross focal neurologic deficits are appreciated. No gait instability. Skin:  Skin is warm, dry and intact. No rash noted. Psychiatric: Mood and affect are normal. Speech and behavior are normal.  ____________________________________________   LABS (all labs ordered are listed, but only abnormal results are displayed)  Labs Reviewed  URINALYSIS, COMPLETE (UACMP) WITH MICROSCOPIC - Abnormal; Notable for the following components:      Result Value   Color, Urine STRAW (*)    APPearance HAZY (*)    Specific Gravity, Urine 1.004 (*)    Hgb urine dipstick MODERATE (*)    Leukocytes, UA SMALL (*)    All other components within normal limits  POC URINE PREG, ED  POCT PREGNANCY, URINE    RADIOLOGY   Official radiology report(s): Ct Renal Stone Study  Result Date: 08/28/2017 CLINICAL DATA:  Low back pain.  No urinary changes. EXAM: CT ABDOMEN AND  PELVIS WITHOUT CONTRAST TECHNIQUE: Multidetector CT imaging of the abdomen and pelvis was performed following the standard protocol without IV contrast. COMPARISON:  None. FINDINGS: Lower chest: No acute abnormality. Hepatobiliary: No focal liver abnormality is seen. No gallstones, gallbladder wall thickening, or biliary dilatation. Pancreas: Unremarkable. No pancreatic ductal dilatation or surrounding inflammatory changes. Spleen: Normal in size without focal abnormality. Adrenals/Urinary Tract: Adrenal glands are unremarkable. Kidneys are normal, without renal calculi, focal lesion, or hydronephrosis. Bladder is unremarkable. Stomach/Bowel: Stomach is within normal limits. Appendix appears normal. No evidence of bowel wall thickening, distention, or inflammatory changes. Vascular/Lymphatic: No significant vascular findings are present. No  enlarged abdominal or pelvic lymph nodes. Reproductive: Uterus and bilateral adnexa are unremarkable. Intrauterine device in satisfactory position. Other: No abdominal wall hernia or abnormality. No abdominopelvic ascites. Musculoskeletal: No acute or significant osseous findings. IMPRESSION: 1. No acute abdominal or pelvic pathology. No urolithiasis or obstructive uropathy. Electronically Signed   By: Elige Ko   On: 08/28/2017 15:39    ____________________________________________   PROCEDURES  Procedure(s) performed: None  Procedures  Critical Care performed: No  ____________________________________________   INITIAL IMPRESSION / ASSESSMENT AND PLAN / ED COURSE  As part of my medical decision making, I reviewed the following data within the electronic MEDICAL RECORD NUMBER Notes from prior ED visits and [place] Controlled Substance Database  Patient was given tramadol and Flexeril while in the ED.  Patient was reassured that CT was negative for stone.  She is to follow-up with her PCP if any continued problems and states that she does have an appointment tomorrow  to be seen.  Patient was discharged with a prescription for Flexeril which she has taken in the past and tramadol if needed for pain.  She is encouraged to drink fluids.  Patient was improved prior to discharge.  ____________________________________________   FINAL CLINICAL IMPRESSION(S) / ED DIAGNOSES  Final diagnoses:  Muscle spasm of back  Acute right-sided low back pain without sciatica     ED Discharge Orders        Ordered    cyclobenzaprine (FLEXERIL) 5 MG tablet  3 times daily PRN     08/28/17 1610    traMADol (ULTRAM) 50 MG tablet  Every 6 hours PRN     08/28/17 1610       Note:  This document was prepared using Dragon voice recognition software and may include unintentional dictation errors.    Tommi Rumps, PA-C 08/28/17 1752    Emily Filbert, MD 08/29/17 [phone]

## 2017-08-28 NOTE — ED Notes (Signed)
See triage note  Presents with lower back pain   States pain started this am  Ambulates well no injury or urinary sx's

## 2017-08-28 NOTE — ED Triage Notes (Signed)
Pt to ED from home c/o lower back pain that started this morning, denies urinary changes.  Pain is constant with intermittent severe spasms.  Denies injuries.

## 2017-08-28 NOTE — Discharge Instructions (Signed)
Keep your appointment with your doctor tomorrow.  Begin taking Flexeril twice a day for spasms.  Use ice or heat to your back and stay with the one that helps you the most.  Do not take pain medication and muscle relaxant if you plan to drive.   Return to the emergency department if any severe worsening of your symptoms.

## 2020-04-22 ENCOUNTER — Encounter (INDEPENDENT_AMBULATORY_CARE_PROVIDER_SITE_OTHER): Admitting: Nurse Practitioner

## 2021-12-12 ENCOUNTER — Encounter (INDEPENDENT_AMBULATORY_CARE_PROVIDER_SITE_OTHER): Payer: Self-pay

## 2023-08-21 ENCOUNTER — Emergency Department (HOSPITAL_BASED_OUTPATIENT_CLINIC_OR_DEPARTMENT_OTHER)
Admission: EM | Admit: 2023-08-21 | Discharge: 2023-08-21 | Disposition: A | Discharge status: Home / Self Care | Attending: Emergency Medicine | Admitting: Emergency Medicine

## 2023-08-21 ENCOUNTER — Other Ambulatory Visit: Payer: Self-pay

## 2023-08-21 ENCOUNTER — Encounter (HOSPITAL_BASED_OUTPATIENT_CLINIC_OR_DEPARTMENT_OTHER): Payer: Self-pay

## 2023-08-21 DIAGNOSIS — N939 Abnormal uterine and vaginal bleeding, unspecified: Secondary | ICD-10-CM | POA: Insufficient documentation

## 2023-08-21 LAB — CBC
HCT: 37.4 % (ref 36.0–46.0)
Hemoglobin: 12.3 g/dL (ref 12.0–15.0)
MCH: 30.4 pg (ref 26.0–34.0)
MCHC: 32.9 g/dL (ref 30.0–36.0)
MCV: 92.6 fL (ref 80.0–100.0)
Platelets: 397 K/uL (ref 150–400)
RBC: 4.04 MIL/uL (ref 3.87–5.11)
RDW: 13.4 % (ref 11.5–15.5)
WBC: 9.5 K/uL (ref 4.0–10.5)
nRBC: 0 % (ref 0.0–0.2)

## 2023-08-21 LAB — COMPREHENSIVE METABOLIC PANEL WITH GFR
ALT: 10 U/L (ref 0–44)
AST: 21 U/L (ref 15–41)
Albumin: 4.3 g/dL (ref 3.5–5.0)
Alkaline Phosphatase: 66 U/L (ref 38–126)
Anion gap: 9 (ref 5–15)
BUN: 11 mg/dL (ref 6–20)
CO2: 25 mmol/L (ref 22–32)
Calcium: 9 mg/dL (ref 8.9–10.3)
Chloride: 103 mmol/L (ref 98–111)
Creatinine, Ser: 0.79 mg/dL (ref 0.44–1.00)
GFR, Estimated: >60 mL/min (ref >60)
Glucose, Bld: 97 mg/dL (ref 70–99)
Potassium: 4.1 mmol/L (ref 3.5–5.1)
Sodium: 137 mmol/L (ref 135–145)
Total Bilirubin: 0.5 mg/dL (ref 0.0–1.2)
Total Protein: 7.5 g/dL (ref 6.5–8.1)

## 2023-08-21 LAB — PREGNANCY, URINE: Preg Test, Ur: NEGATIVE

## 2023-08-21 NOTE — ED Notes (Signed)
 ED Provider at bedside.

## 2023-08-21 NOTE — ED Triage Notes (Signed)
 Pt presents via POV c/o vaginal bleeding since 07/17/23. Reports had IUD. Reports has had some abd cramping. Also reports dizziness.

## 2023-08-21 NOTE — ED Provider Notes (Signed)
 [place] [facility] DE[facility]MENT AT [facility]  Provider Note  CSN: [id number] Arrival date & time: 08/21/23 0048  History Chief Complaint  Patient presents with   Vaginal Bleeding    Katie Rose is a 33 y.o. female reports she has had an IUD in place for approx 11 years. She reports in the last few weeks she has had heavier periods and bleeding earlier than expected. She reports she can no longer feel the strings of her IUD. She is not having any pain. Some general weakness.    Home Medications Prior to Admission medications   Medication Sig Start Date End Date Taking? Authorizing Provider  cyclobenzaprine  (FLEXERIL ) 5 MG tablet Take 1 tablet (5 mg total) by mouth 3 (three) times daily as needed for muscle spasms. 08/28/17   Saunders Shona CROME, PA-C  EPINEPHrine  (EPIPEN  2-PAK) 0.3 mg/0.3 mL IJ SOAJ injection Inject 0.3 mLs (0.3 mg total) into the muscle once. Patient not taking: Reported on 11/03/2016 09/04/15   Goodman, Graydon, MD  [facility] INTRAUTERINE COPPER IUD IUD 1 each by Intrauterine route once.    [provider]  promethazine  (PHENERGAN ) 25 MG tablet Take 1 tablet (25 mg total) by mouth every 6 (six) hours as needed for nausea or vomiting. 01/26/17   Lawyer, Lonni, PA-C  topiramate (TOPAMAX) 100 MG tablet Take 100 mg by mouth daily.  05/10/14   [provider]  traMADol  (ULTRAM ) 50 MG tablet Take 1 tablet (50 mg total) by mouth every 6 (six) hours as needed. 08/28/17   Saunders Shona CROME, PA-C     Allergies    Patient has no known allergies.   Review of Systems   Review of Systems Please see HPI for pertinent positives and negatives  Physical Exam BP (!) 128/92   Pulse 92   Temp 98.4 F (36.9 C) (Oral)   Resp 16   Ht 5' 3 (1.6 m)   Wt 56.7 kg   SpO2 100%   BMI 22.14 kg/m   Physical Exam Vitals and nursing note reviewed.  HENT:     Head: Normocephalic.     Nose: Nose normal.  Eyes:     Extraocular Movements: Extraocular  movements intact.  Pulmonary:     Effort: Pulmonary effort is normal.  Musculoskeletal:        General: Normal range of motion.     Cervical back: Neck supple.  Skin:    Findings: No rash (on exposed skin).  Neurological:     Mental Status: She is alert and oriented to person, place, and time.  Psychiatric:        Mood and Affect: Mood normal.     ED Results / Procedures / Treatments   EKG None  Procedures Procedures  Medications Ordered in the ED Medications - No data to display  Initial Impression and Plan  Patient here with heavy vaginal bleeding in setting of IUD past it's normal life span. She has reassuring vitals. Labs done in triage show normal CBC, CMP, neg hcg. She was offered pelvic exam and possible IUD removal, but she will defer both to her Gyn. Reassured no significant blood loss requiring transfusion. PCP follow up, RTED for any other concerns.    ED Course       MDM Rules/Calculators/A&P Medical Decision Making Problems Addressed: Abnormal vaginal bleeding: acute illness or injury  Amount and/or Complexity of Data Reviewed Labs: ordered. Decision-making details documented in ED Course.     Final Clinical Impression(s) / ED Diagnoses  Final diagnoses:  Abnormal vaginal bleeding    Rx / DC Orders ED Discharge Orders     None        Roselyn Carlin NOVAK, MD 08/21/23 [phone]
